# Patient Record
Sex: Female | Born: 1955 | Race: White | Hispanic: No | Smoking: Former smoker
Health system: Southern US, Community
[De-identification: ages and names within clinical notes are randomized; demographics above are authoritative.]

## PROBLEM LIST (undated history)

## (undated) DIAGNOSIS — M1990 Unspecified osteoarthritis, unspecified site: Secondary | ICD-10-CM

## (undated) DIAGNOSIS — Z8719 Personal history of other diseases of the digestive system: Secondary | ICD-10-CM

## (undated) DIAGNOSIS — K219 Gastro-esophageal reflux disease without esophagitis: Secondary | ICD-10-CM

## (undated) DIAGNOSIS — D649 Anemia, unspecified: Secondary | ICD-10-CM

## (undated) DIAGNOSIS — I10 Essential (primary) hypertension: Secondary | ICD-10-CM

## (undated) DIAGNOSIS — G4730 Sleep apnea, unspecified: Secondary | ICD-10-CM

## (undated) DIAGNOSIS — T753XXA Motion sickness, initial encounter: Secondary | ICD-10-CM

## (undated) DIAGNOSIS — T8859XA Other complications of anesthesia, initial encounter: Secondary | ICD-10-CM

## (undated) DIAGNOSIS — T4145XA Adverse effect of unspecified anesthetic, initial encounter: Secondary | ICD-10-CM

## (undated) HISTORY — PX: LAPAROSCOPIC HYSTERECTOMY: SHX1926

## (undated) HISTORY — PX: ABDOMINAL HYSTERECTOMY: SHX81

## (undated) HISTORY — PX: VAGINAL PROLAPSE REPAIR: SHX830

## (undated) HISTORY — PX: CHOLECYSTECTOMY: SHX55

## (undated) HISTORY — PX: INCONTINENCE SURGERY: SHX676

## (undated) HISTORY — PX: BLADDER SUSPENSION: SHX72

## (undated) HISTORY — PX: ECTOPIC PREGNANCY SURGERY: SHX613

---

## 2005-04-04 ENCOUNTER — Ambulatory Visit: Payer: Self-pay | Admitting: Obstetrics and Gynecology

## 2006-04-05 ENCOUNTER — Ambulatory Visit: Payer: Self-pay | Admitting: Obstetrics and Gynecology

## 2006-06-27 ENCOUNTER — Ambulatory Visit: Payer: Self-pay | Admitting: Unknown Physician Specialty

## 2006-09-08 ENCOUNTER — Ambulatory Visit: Payer: Self-pay | Admitting: Unknown Physician Specialty

## 2007-08-29 ENCOUNTER — Ambulatory Visit: Payer: Self-pay | Admitting: Obstetrics and Gynecology

## 2008-09-02 ENCOUNTER — Ambulatory Visit: Payer: Self-pay | Admitting: Obstetrics and Gynecology

## 2009-09-25 ENCOUNTER — Ambulatory Visit: Payer: Self-pay | Admitting: Obstetrics and Gynecology

## 2010-10-08 ENCOUNTER — Ambulatory Visit: Payer: Self-pay | Admitting: Obstetrics and Gynecology

## 2011-10-26 ENCOUNTER — Ambulatory Visit: Payer: Self-pay | Admitting: Obstetrics and Gynecology

## 2012-09-26 ENCOUNTER — Ambulatory Visit: Payer: Self-pay | Admitting: Obstetrics and Gynecology

## 2012-09-26 LAB — URINALYSIS, COMPLETE
Bilirubin,UR: NEGATIVE
Glucose,UR: NEGATIVE mg/dL (ref 0–75)
Ketone: NEGATIVE
Protein: NEGATIVE
RBC,UR: <1 /HPF (ref 0–5)
Squamous Epithelial: 1
WBC UR: 1 /HPF (ref 0–5)

## 2012-09-26 LAB — HEMOGLOBIN: HGB: 13.8 g/dL (ref 12.0–16.0)

## 2012-10-05 ENCOUNTER — Ambulatory Visit: Payer: Self-pay | Admitting: Obstetrics and Gynecology

## 2012-11-01 ENCOUNTER — Ambulatory Visit: Payer: Self-pay | Admitting: Obstetrics and Gynecology

## 2013-04-30 IMAGING — MG MM CAD SCREENING MAMMO
1 series · 6 of 6 positions shown · non-contrast
Comparison: none

REASON FOR EXAM: scr mammo no order
COMMENTS:

PROCEDURE:     MAM - MAM DGTL SCRN MAM NO ORDER W/CAD  - November 01, 2012  [DATE]
RESULT:      Comparison made to prior studies dating to 04/05/2006. No mass.
No pathologic clustered calcification. CAD evaluation is nonfocal.

[R CC · right · 6 of 6 slices shown]
[im 1/6]
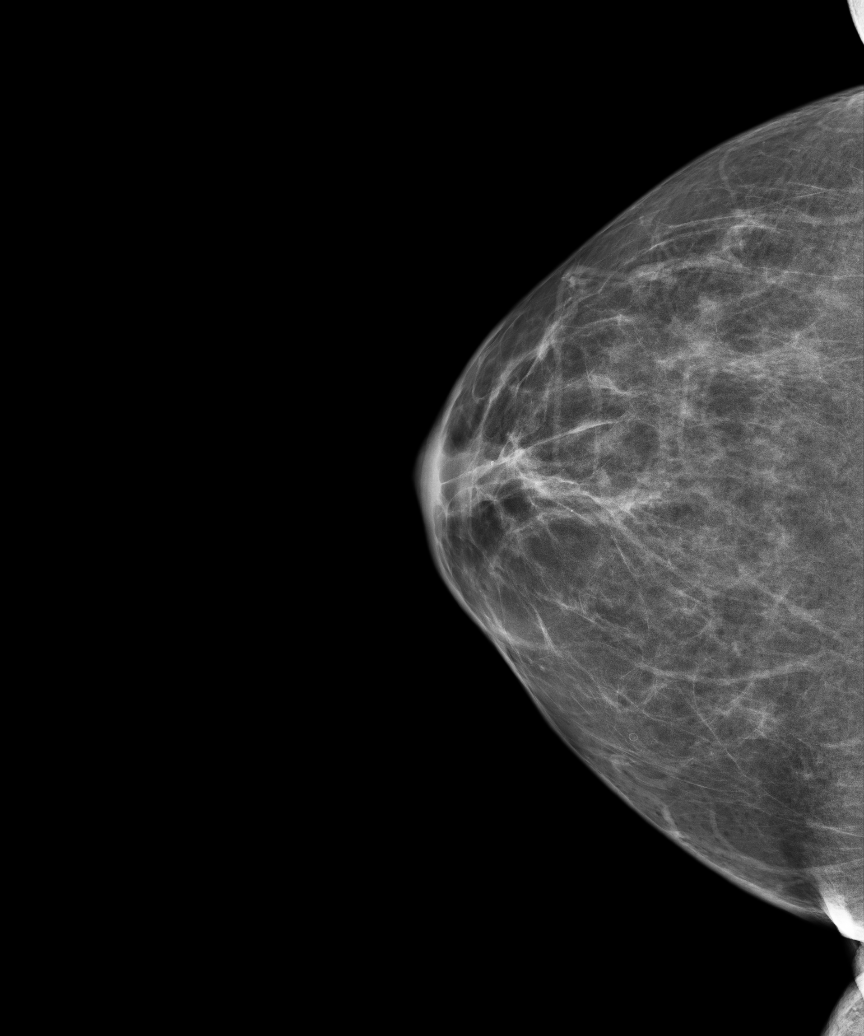
[im 2/6]
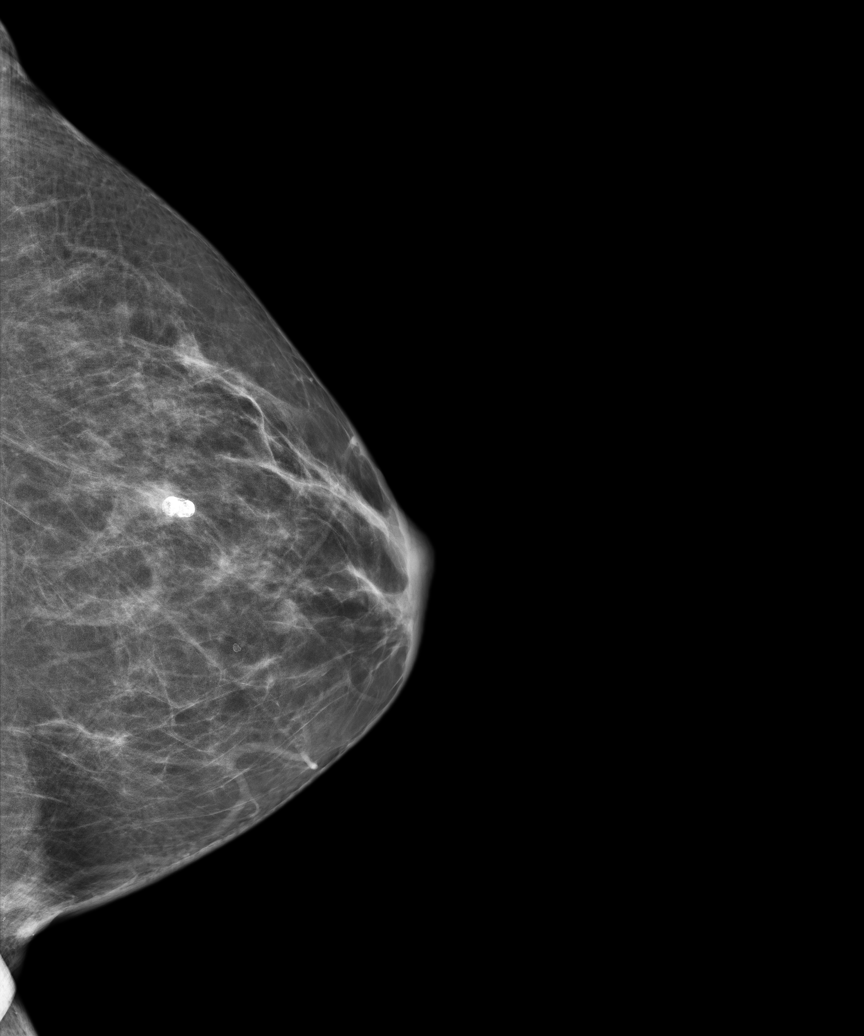
[im 3/6]
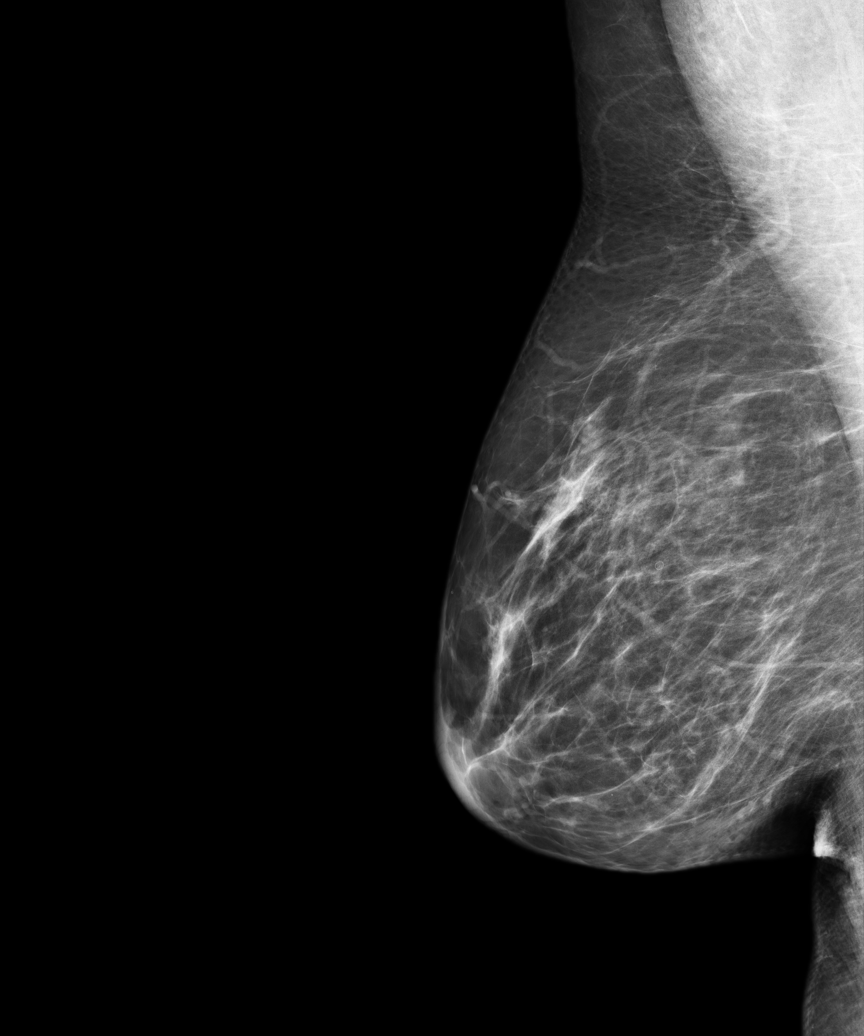
[im 4/6]
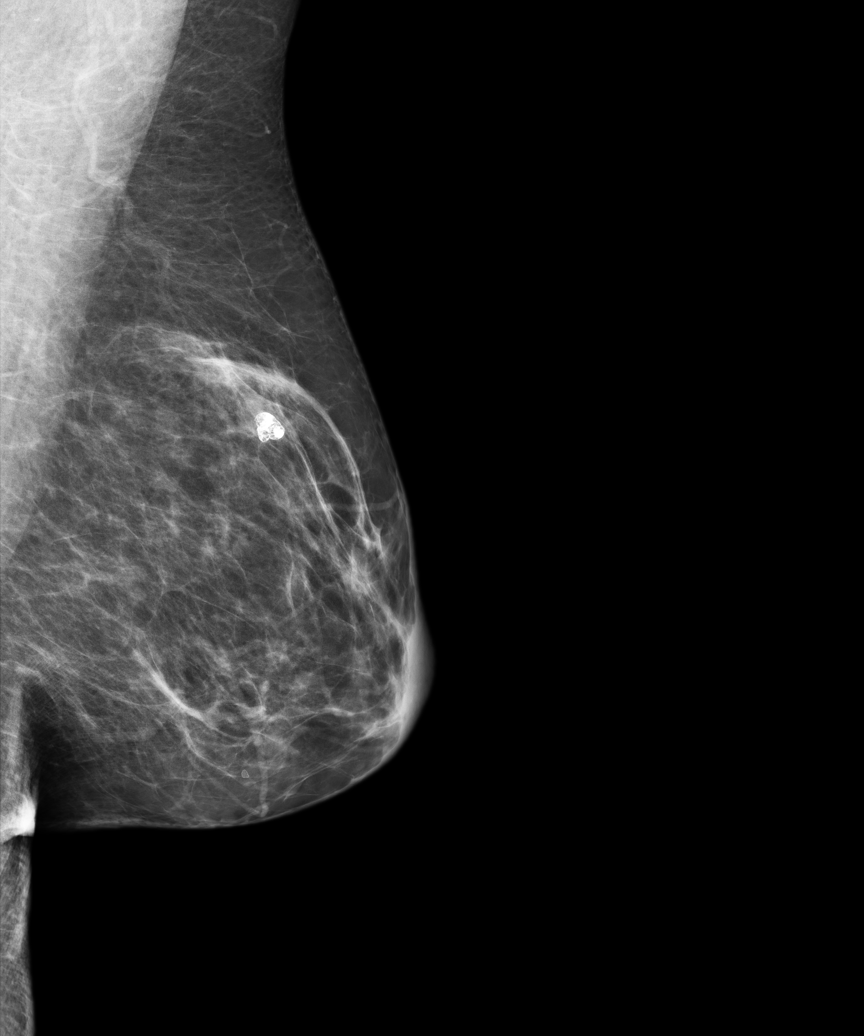
[im 5/6]
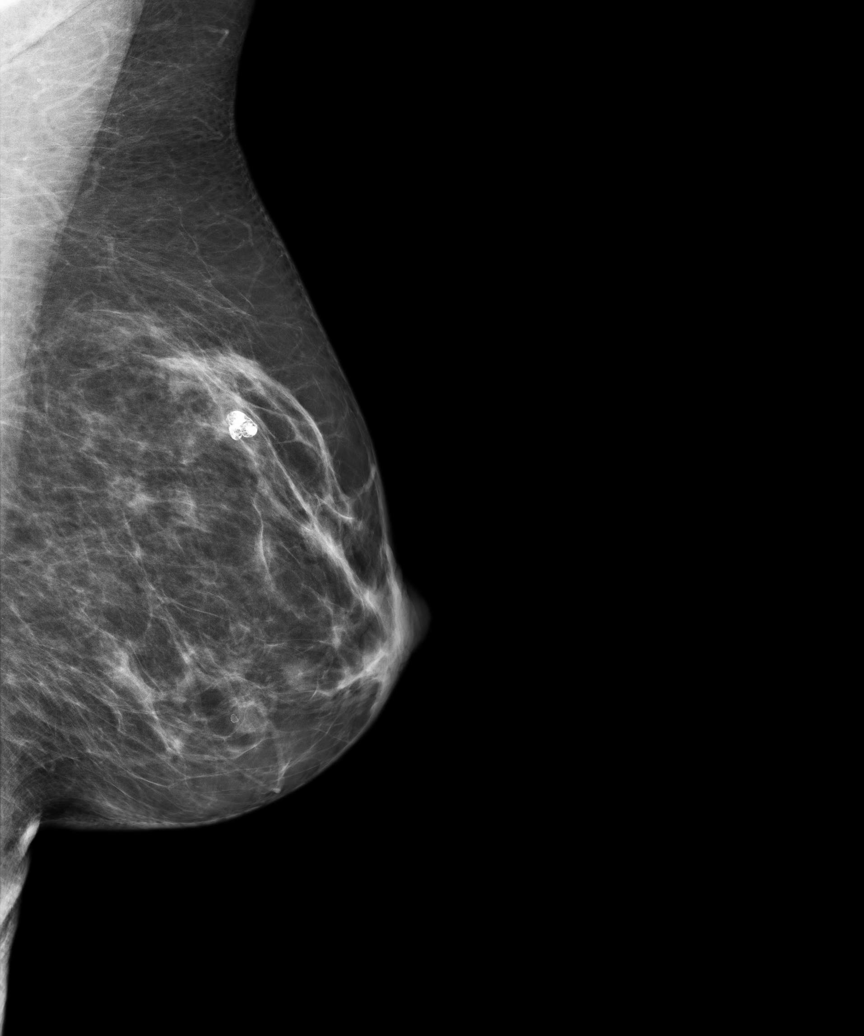
[im 6/6]
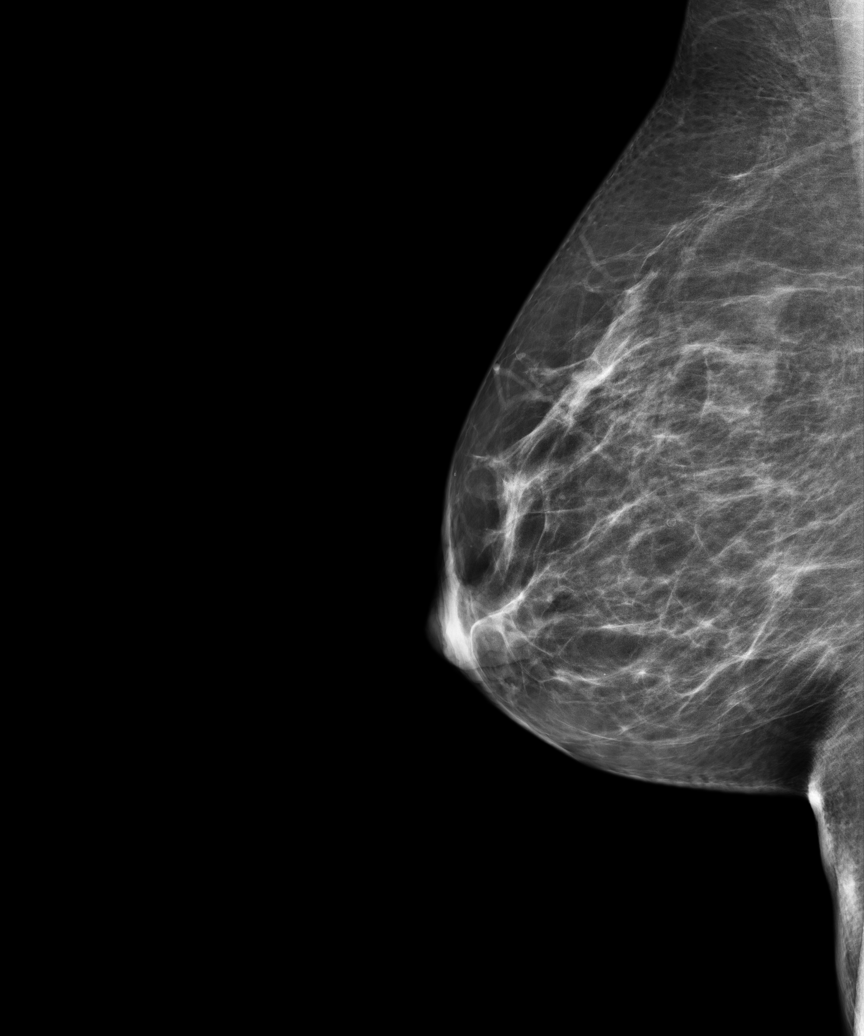

[6 of 6 positions shown; findings below may reference images not displayed]

IMPRESSION: Stable benign exam. Yearly followup exam suggested.

BI-RADS: Category 2- Benign Finding

BREAST COMPOSITION: The breast composition is SCATTERED FIBROGLANDULAR
TISSUE (glandular tissue is 25-50%)

A NEGATIVE MAMMOGRAM REPORT DOES NOT PRECLUDE BIOPSY OR OTHER EVALUATION OF
A CLINICALLY PALPABLE OR OTHERWISE SUSPICIOUS MASS OR LESION. BREAST CANCER
MAY NOT BE DETECTED IN UP TO 10% OF CASES.

## 2013-11-05 ENCOUNTER — Ambulatory Visit: Payer: Self-pay | Admitting: Obstetrics and Gynecology

## 2015-02-17 NOTE — Op Note (Signed)
PATIENT NAME:  Mariah Cooper, Mariah Cooper MR#:  [id number] DATE OF BIRTH:  Aug 23, 1956  DATE OF PROCEDURE:  10/05/2012  PREOPERATIVE DIAGNOSIS: Genuine stress incontinence.   POSTOPERATIVE DIAGNOSIS: Genuine stress incontinence.   PROCEDURE: Tension-free vaginal tape.   SURGEON: Ricky L. Logan BoresEvans, MD  ANESTHESIA: LMA.   FINDINGS: Evidence consistent with stress incontinence on preoperative work-up. Cystoscopy during the procedure showed no entry into the bladder with either arm of the TVT.  ESTIMATED BLOOD LOSS: 75 mL.  COMPLICATIONS: None.   DRAINS: Foley connected to leg bag which will be left in over the weekend.   COMPLICATIONS: None.   SPECIMENS: None.   PROCEDURE IN DETAIL: Preoperative work-up showed genuine stress incontinence. I discussed options and elected to proceed with the above procedure. Consent was signed. Antibiotics were given. She was taken to the operating room and placed in the supine position where anesthesia was initiated and then placed in the dorsal lithotomy position using Allen stirrups, prepped and draped in the usual sterile fashion. An area of approximately 2.5 cm from the external urethral orifice was grasped in the midline and subcutaneously infused with approximately 2 mL of 0.05% Sensorcaine. A #15 blade was used to make a 1.5 cm incision and the areas of periurethral tissue, right and left, were then injected with another 20 mL total of 0.5% Sensorcaine. Shallow dissection of the periurethral area bilaterally was then carried out and obturators were placed using disposable TVT system. Cystoscopy after placement of each arm and prior to removal from applicator and showed no entry into the bladder.   The tape was then adjusted to rest approximately 3 mm below the urethra at rest and protective coating was removed and tapes were trimmed to length. There was oozing from the right periurethral tissue which was controlled with pressure and direct cautery. The  incision was then closed with single deep of 3-0 Vicryl and simple interrupted sutures of 0 and 3-0 Vicryl. The Foley catheter was inserted. Prior to placement of the Foley, dilator was passed through the urethra easily without any evidence of resistance and then catheter was placed and fitted to leg bag.   The catheter was marked in the area where the patient can remove it at home prior to return Monday when she will come back for catheter removal.   We will send her home on antibiotics, bladder anesthetic, and pain medicine for the weekend. Sherlynn Stalls. Light activity and typical precautions.  ____________________________ Reatha Harpsicky L. Logan BoresEvans, MD rle:slb D: 10/05/2012 11:29:19 ET T: 10/05/2012 11:50:03 ET JOB#: [id number]  cc: Ricky L. Logan BoresEvans, MD, <Dictator> Augustina MoodICK L Shital Crayton MD ELECTRONICALLY SIGNED 10/08/2012 7:29

## 2015-08-25 ENCOUNTER — Encounter: Payer: Self-pay | Admitting: Emergency Medicine

## 2015-08-25 ENCOUNTER — Emergency Department: Payer: Worker's Compensation

## 2015-08-25 ENCOUNTER — Emergency Department
Admission: EM | Admit: 2015-08-25 | Discharge: 2015-08-25 | Disposition: A | Discharge status: Home / Self Care | Payer: Worker's Compensation | Attending: Student | Admitting: Student

## 2015-08-25 DIAGNOSIS — Y9289 Other specified places as the place of occurrence of the external cause: Secondary | ICD-10-CM | POA: Insufficient documentation

## 2015-08-25 DIAGNOSIS — Y990 Civilian activity done for income or pay: Secondary | ICD-10-CM | POA: Diagnosis not present

## 2015-08-25 DIAGNOSIS — W07XXXA Fall from chair, initial encounter: Secondary | ICD-10-CM | POA: Diagnosis not present

## 2015-08-25 DIAGNOSIS — S52592A Other fractures of lower end of left radius, initial encounter for closed fracture: Secondary | ICD-10-CM | POA: Insufficient documentation

## 2015-08-25 DIAGNOSIS — Y9389 Activity, other specified: Secondary | ICD-10-CM | POA: Insufficient documentation

## 2015-08-25 DIAGNOSIS — S62102A Fracture of unspecified carpal bone, left wrist, initial encounter for closed fracture: Secondary | ICD-10-CM

## 2015-08-25 DIAGNOSIS — Z87891 Personal history of nicotine dependence: Secondary | ICD-10-CM | POA: Diagnosis not present

## 2015-08-25 DIAGNOSIS — S62101A Fracture of unspecified carpal bone, right wrist, initial encounter for closed fracture: Secondary | ICD-10-CM

## 2015-08-25 DIAGNOSIS — S6991XA Unspecified injury of right wrist, hand and finger(s), initial encounter: Secondary | ICD-10-CM | POA: Diagnosis present

## 2015-08-25 DIAGNOSIS — S52591A Other fractures of lower end of right radius, initial encounter for closed fracture: Secondary | ICD-10-CM | POA: Insufficient documentation

## 2015-08-25 LAB — BASIC METABOLIC PANEL
Anion gap: 7 (ref 5–15)
BUN: 17 mg/dL (ref 6–20)
CHLORIDE: 105 mmol/L (ref 101–111)
CO2: 26 mmol/L (ref 22–32)
Calcium: 10 mg/dL (ref 8.9–10.3)
Creatinine, Ser: 0.8 mg/dL (ref 0.44–1.00)
GFR calc Af Amer: >60 mL/min (ref >60)
GFR calc non Af Amer: >60 mL/min (ref >60)
Glucose, Bld: 168 mg/dL — ABNORMAL HIGH (ref 65–99)
POTASSIUM: 4.6 mmol/L (ref 3.5–5.1)
SODIUM: 138 mmol/L (ref 135–145)

## 2015-08-25 LAB — CBC WITH DIFFERENTIAL/PLATELET
Basophils Absolute: 0 10*3/uL (ref 0–0.1)
Basophils Relative: 0 %
EOS PCT: 1 %
Eosinophils Absolute: 0.1 10*3/uL (ref 0–0.7)
HEMATOCRIT: 39.6 % (ref 35.0–47.0)
HEMOGLOBIN: 13.1 g/dL (ref 12.0–16.0)
LYMPHS ABS: 2.3 10*3/uL (ref 1.0–3.6)
LYMPHS PCT: 19 %
MCH: 31.1 pg (ref 26.0–34.0)
MCHC: 33.1 g/dL (ref 32.0–36.0)
MCV: 94.1 fL (ref 80.0–100.0)
Monocytes Absolute: 1 10*3/uL — ABNORMAL HIGH (ref 0.2–0.9)
Monocytes Relative: 8 %
NEUTROS ABS: 9.1 10*3/uL — AB (ref 1.4–6.5)
Neutrophils Relative %: 72 %
Platelets: 324 10*3/uL (ref 150–440)
RBC: 4.21 MIL/uL (ref 3.80–5.20)
RDW: 12.4 % (ref 11.5–14.5)
WBC: 12.5 10*3/uL — AB (ref 3.6–11.0)

## 2015-08-25 MED ORDER — HYDROMORPHONE HCL 1 MG/ML IJ SOLN
1.0000 mg | Freq: Once | INTRAMUSCULAR | Status: AC
  Administered 2015-08-25: 1 mg via INTRAVENOUS
  Filled 2015-08-25: qty 1

## 2015-08-25 MED ORDER — OXYCODONE-ACETAMINOPHEN 7.5-325 MG PO TABS: 1.0000 | ORAL_TABLET | ORAL | Status: AC | PRN

## 2015-08-25 MED ORDER — HYDROMORPHONE HCL 1 MG/ML IJ SOLN: 1.0000 mg | Freq: Once | INTRAMUSCULAR | Status: DC

## 2015-08-25 NOTE — ED Notes (Signed)
Pt to ed with c/o fall today while at work,  Pt states she was standing in chair and fell,  Pt with c/o pain to bilat wrists.

## 2015-08-25 NOTE — Discharge Instructions (Signed)
Wrist Fracture °A wrist fracture is a break or crack in one of the bones of your wrist. Your wrist is made up of eight small bones at the palm of your hand (carpal bones) and two long bones that make up your forearm (radius and ulna). °CAUSES °· A direct blow to the wrist. °· Falling on an outstretched hand. °· Trauma, such as a car accident or a fall. °RISK FACTORS °Risk factors for wrist fracture include: °· Participating in contact and high-risk sports, such as skiing, biking, and ice skating. °· Taking steroid medicines. °· Smoking. °· Being female. °· Being Caucasian. °· Drinking more than three alcoholic beverages per day. °· Having low or lowered bone density (osteoporosis or osteopenia). °· Age. Older adults have decreased bone density. °· Women who have had menopause. °· History of previous fractures. °SIGNS AND SYMPTOMS °Symptoms of wrist fractures include tenderness, bruising, and inflammation. Additionally, the wrist may hang in an odd position or appear deformed. °DIAGNOSIS °Diagnosis may include: °· Physical exam. °· X-ray. °TREATMENT °Treatment depends on many factors, including the nature and location of the fracture, your age, and your activity level. Treatment for wrist fracture can be nonsurgical or surgical. °Nonsurgical Treatment °A plaster cast or splint may be applied to your wrist if the bone is in a good position. If the fracture is not in good position, it may be necessary for your health care provider to realign it before applying a splint or cast. Usually, a cast or splint will be worn for several weeks. °Surgical Treatment °Sometimes the position of the bone is so far out of place that surgery is required to apply a device to hold it together as it heals. Depending on the fracture, there are a number of options for holding the bone in place while it heals, such as a cast and metal pins. °HOME CARE INSTRUCTIONS °· Keep your injured wrist elevated and move your fingers as much as  possible. °· Do not put pressure on any part of your cast or splint. It may break. °· Use a plastic bag to protect your cast or splint from water while bathing or showering. Do not lower your cast or splint into water. °· Take medicines only as directed by your health care provider. °· Keep your cast or splint clean and dry. If it becomes wet, damaged, or suddenly feels too tight, contact your health care provider right away. °· Do not use any tobacco products including cigarettes, chewing tobacco, or electronic cigarettes. Tobacco can delay bone healing. If you need help quitting, ask your health care provider. °· Keep all follow-up visits as directed by your health care provider. This is important. °· Ask your health care provider if you should take supplements of calcium and vitamins C and D to promote bone healing. °SEEK MEDICAL CARE IF: °· Your cast or splint is damaged, breaks, or gets wet. °· You have a fever. °· You have chills. °· You have continued severe pain or more swelling than you did before the cast was put on. °SEEK IMMEDIATE MEDICAL CARE IF: °· Your hand or fingernails on the injured arm turn blue or gray, or feel cold or numb. °· You have decreased feeling in the fingers of your injured arm. °MAKE SURE YOU: °· Understand these instructions. °· Will watch your condition. °· Will get help right away if you are not doing well or get worse. °  °This information is not intended to replace advice given to you by your   health care provider. Make sure you discuss any questions you have with your health care provider. °  °Document Released: 07/27/2005 Document Revised: 07/08/2015 Document Reviewed: 11/04/2011 °Elsevier Interactive Patient Education ©2016 [employer] ° °Cast or Splint Care °Casts and splints support injured limbs and keep bones from moving while they heal. It is important to care for your cast or splint at home.   °HOME CARE INSTRUCTIONS °· Keep the cast or splint uncovered during the  drying period. It can take 24 to 48 hours to dry if it is made of plaster. A fiberglass cast will dry in less than 1 hour. °· Do not rest the cast on anything harder than a pillow for the first 24 hours. °· Do not put weight on your injured limb or apply pressure to the cast until your health care provider gives you permission. °· Keep the cast or splint dry. Wet casts or splints can lose their shape and may not support the limb as well. A wet cast that has lost its shape can also create harmful pressure on your skin when it dries. Also, wet skin can become infected. °¨ Cover the cast or splint with a plastic bag when bathing or when out in the rain or snow. If the cast is on the trunk of the body, take sponge baths until the cast is removed. °¨ If your cast does become wet, dry it with a towel or a blow dryer on the cool setting only. °· Keep your cast or splint clean. Soiled casts may be wiped with a moistened cloth. °· Do not place any hard or soft foreign objects under your cast or splint, such as cotton, toilet paper, lotion, or powder. °· Do not try to scratch the skin under the cast with any object. The object could get stuck inside the cast. Also, scratching could lead to an infection. If itching is a problem, use a blow dryer on a cool setting to relieve discomfort. °· Do not trim or cut your cast or remove padding from inside of it. °· Exercise all joints next to the injury that are not immobilized by the cast or splint. For example, if you have a long leg cast, exercise the hip joint and toes. If you have an arm cast or splint, exercise the shoulder, elbow, thumb, and fingers. °· Elevate your injured arm or leg on 1 or 2 pillows for the first 1 to 3 days to decrease swelling and pain. It is best if you can comfortably elevate your cast so it is higher than your heart. °SEEK MEDICAL CARE IF:  °· Your cast or splint cracks. °· Your cast or splint is too tight or too loose. °· You have unbearable itching  inside the cast. °· Your cast becomes wet or develops a soft spot or area. °· You have a bad smell coming from inside your cast. °· You get an object stuck under your cast. °· Your skin around the cast becomes red or raw. °· You have new pain or worsening pain after the cast has been applied. °SEEK IMMEDIATE MEDICAL CARE IF:  °· You have fluid leaking through the cast. °· You are unable to move your fingers or toes. °· You have discolored (blue or white), cool, painful, or very swollen fingers or toes beyond the cast. °· You have tingling or numbness around the injured area. °· You have severe pain or pressure under the cast. °· You have any difficulty with your breathing or have shortness of   breath. °· You have chest pain. °  °This information is not intended to replace advice given to you by your health care provider. Make sure you discuss any questions you have with your health care provider. °  °Document Released: 10/14/2000 Document Revised: 08/07/2013 Document Reviewed: 04/25/2013 °Elsevier Interactive Patient Education ©2016 [employer] Inc. ° °

## 2015-08-25 NOTE — ED Provider Notes (Signed)
[facility] Emergency Department Provider Note  ____________________________________________  Time seen: Approximately 9:34 AM  I have reviewed the triage vital signs and the nursing notes.   HISTORY  Chief Complaint Fall and Wrist Pain    HPI Mariah Cooper is a 59 y.o. female patient complaining of bilateral wrist pain and deformity secondary to a fall. Patient is at work stating the chair and fell. Patient broke the fall with hands. Patient rates her pain discomfort as a 5/10. Patient denies any loss of sensation. He stated decreased range of motion secondary to complain of pain. No palliative measures taken for this complaint.   History reviewed. No pertinent past medical history.  There are no active problems to display for this patient.   History reviewed. No pertinent past surgical history.  Current Outpatient Rx  Name  Route  Sig  Dispense  Refill  . oxyCODONE-acetaminophen (PERCOCET) 7.5-325 MG tablet   Oral   Take 1 tablet by mouth every 4 (four) hours as needed for severe pain.   20 tablet   0     Allergies Sulfa antibiotics and Ivp dye  History reviewed. No pertinent family history.  Social History Social History  Substance Use Topics  . Smoking status: Former Games developermoker  . Smokeless tobacco: None  . Alcohol Use: No    Review of Systems Constitutional: No fever/chills Eyes: No visual changes. ENT: No sore throat. Cardiovascular: Denies chest pain. Respiratory: Denies shortness of breath. Gastrointestinal: No abdominal pain.  No nausea, no vomiting.  No diarrhea.  No constipation. Genitourinary: Negative for dysuria. Musculoskeletal bilateral wrist pain  Skin: Negative for rash. Neurological: Negative for headaches, focal weakness or numbness. Endocrine: Hematological/Lymphatic: Allergic/Immunilogical: Sulfur and IVP dye  10-point ROS otherwise  negative.  ____________________________________________   PHYSICAL EXAM:  VITAL SIGNS: ED Triage Vitals  Enc Vitals Group     BP 08/25/15 0842 155/55 mmHg     Pulse Rate 08/25/15 0842 57     Resp 08/25/15 0842 20     Temp 08/25/15 0842 97.7 F (36.5 C)     Temp Source 08/25/15 0842 Oral     SpO2 08/25/15 0842 98 %     Weight 08/25/15 0842 150 lb (68.04 kg)     Height 08/25/15 0842 5\' 2"  (1.575 m)     Head Cir --      Peak Flow --      Pain Score 08/25/15 0843 5     Pain Loc --      Pain Edu? --      Excl. in GC? --     Constitutional: Alert and oriented. Well appearing and in no acute distress. Eyes: Conjunctivae are normal. PERRL. EOMI. Head: Atraumatic. Nose: No congestion/rhinnorhea. Mouth/Throat: Mucous membranes are moist.  Oropharynx non-erythematous. Neck: No stridor.  No cervical spine tenderness to palpation. Hematological/Lymphatic/Immunilogical: No cervical lymphadenopathy. Cardiovascular: Normal rate, regular rhythm. Grossly normal heart sounds.  Good peripheral circulation. Respiratory: Normal respiratory effort.  No retractions. Lungs CTAB. Gastrointestinal: Soft and nontender. No distention. No abdominal bruits. No CVA tenderness. Musculoskeletal: No lower extremity tenderness nor edema.  No joint effusions. Neurologic:  Normal speech and language. No gross focal neurologic deficits are appreciated. No gait instability. Skin:  Skin is warm, dry and intact. No rash noted. Psychiatric: Mood and affect are normal. Speech and behavior are normal.  ____________________________________________   LABS (all labs ordered are listed, but only abnormal results are displayed)  Labs Reviewed  CBC WITH DIFFERENTIAL/PLATELET -  Abnormal; Notable for the following:    WBC 12.5 (*)    Neutro Abs 9.1 (*)    Monocytes Absolute 1.0 (*)    All other components within normal limits  BASIC METABOLIC PANEL    ____________________________________________  EKG   ____________________________________________  RADIOLOGY  Right wrist soles of dorsal impacted distal radial fracture. Left wrist shows a comminuted and displaced intra-articular distal radial fracture. I, Joni Reining, personally viewed and evaluated these images (plain radiographs) as part of my medical decision making.   ____________________________________________   PROCEDURES  Procedure(s) performed: None  Critical Care performed: No  ____________________________________________   INITIAL IMPRESSION / ASSESSMENT AND PLAN / ED COURSE  Pertinent labs & imaging results that were available during my care of the patient were reviewed by me and considered in my medical decision making (see chart for details).  Discussed x-ray findings with palpation. Ortho called at  0930 hrs. Patient given IV access and 1 mg of Dilaudid is given. Recall Ortho 1010 hrs. discussed patient with Ortho doctor at 1045. Ortho doctor call back after reviewing films. Per Dr. Hyacinth Meeker orthopedic Dr. patient replaced some bilateral sugar tong splints, prescription for pain medication, and follow-up in his office in 2 days. Patient is pending surgery on 08/31/2015. ____________________________________________   FINAL CLINICAL IMPRESSION(S) / ED DIAGNOSES  Final diagnoses:  Wrist fracture, right, closed, initial encounter  Left wrist fracture, closed, initial encounter      Joni Reining, PA-C 08/25/15 1114  Gayla Doss, MD 08/25/15 [phone]

## 2015-08-28 ENCOUNTER — Encounter: Payer: Self-pay | Admitting: *Deleted

## 2015-08-28 ENCOUNTER — Inpatient Hospital Stay: Admission: RE | Admit: 2015-08-28 | Payer: Self-pay | Source: Ambulatory Visit

## 2015-08-28 NOTE — Patient Instructions (Signed)
  Your procedure is scheduled on: 08-31-15 Report to [facility] 2ND FLOOR To find out your arrival time please call [phone] between 1PM - 3PM on 08-28-15  Remember: Instructions that are not followed completely may result in serious medical risk, up to and including death, or upon the discretion of your surgeon and anesthesiologist your surgery may need to be rescheduled.    _X___ 1. Do not eat food or drink liquids after midnight. No gum chewing or hard candies.     _X___ 2. No Alcohol for 24 hours before or after surgery.   ____ 3. Bring all medications with you on the day of surgery if instructed.    ____ 4. Notify your doctor if there is any change in your medical condition     (cold, fever, infections).     Do not wear jewelry, make-up, hairpins, clips or nail polish.  Do not wear lotions, powders, or perfumes. You may wear deodorant.  Do not shave 48 hours prior to surgery. Men may shave face and neck.  Do not bring valuables to the hospital.    [facility] any belongings or valuables.               Contacts, dentures or bridgework may not be worn into surgery.  Leave your suitcase in the car. After surgery it may be brought to your room.  For patients admitted to the hospital, discharge time is determined by your treatment team.   Patients discharged the day of surgery will not be allowed to drive home.   Please read over the following fact sheets that you were given:      __X__ Take these medicines the morning of surgery with A SIP OF WATER:    1. MAGNESIUM  2. MAY TAKE HYDROCODONE IF NEEDED  3.   4.  5.  6.  ____ Fleet Enema (as directed)   ____ Use CHG Soap as directed  ____ Use inhalers on the day of surgery  ____ Stop metformin 2 days prior to surgery    ____ Take 1/2 of usual insulin dose the night before surgery and none on the morning of surgery.   ____ Stop Coumadin/Plavix/aspirin-N/A  ____ Stop  Anti-inflammatories-NO NSAIDS OR ASA PRODUCTS   ____ Stop supplements until after surgery.    ____ Bring C-Pap to the hospital.

## 2015-08-28 NOTE — Pre-Procedure Instructions (Signed)
SPOKE WITH DR Randa NgoPISCITELLO ABOUT SURGEON JUST STARTING PT ON MELOXICAM 15 MG PO YESTERDAY (08-27-15).  DR Randa NgoPISCITELLO STATES THAT HE IS FINE WITH HER CONTINUING MELOXICAM BUT ULTIMATLEY THAT IS THE SURGEONS CALL IF THE PT NEEDS TO STOP IT.  [facility]. SHE SAID DR MILLER HAD LEFT FOR THE DAY AND THAT SHE WOULD TEXT HIM TO SEE IF HE WOULD RESPOND REGARDING PTS MELOXICAM

## 2015-08-31 ENCOUNTER — Ambulatory Visit
Admission: RE | Admit: 2015-08-31 | Discharge: 2015-08-31 | Disposition: A | Discharge status: Home / Self Care | Payer: Worker's Compensation | Source: Ambulatory Visit | Attending: Specialist | Admitting: Specialist

## 2015-08-31 ENCOUNTER — Encounter: Payer: Self-pay | Admitting: *Deleted

## 2015-08-31 ENCOUNTER — Ambulatory Visit: Payer: Worker's Compensation | Admitting: Anesthesiology

## 2015-08-31 ENCOUNTER — Encounter
Admission: RE | Disposition: A | Discharge status: Home / Self Care | Payer: Self-pay | Source: Ambulatory Visit | Attending: Specialist

## 2015-08-31 DIAGNOSIS — S52532A Colles' fracture of left radius, initial encounter for closed fracture: Secondary | ICD-10-CM | POA: Insufficient documentation

## 2015-08-31 DIAGNOSIS — Z87891 Personal history of nicotine dependence: Secondary | ICD-10-CM | POA: Insufficient documentation

## 2015-08-31 DIAGNOSIS — S52502A Unspecified fracture of the lower end of left radius, initial encounter for closed fracture: Secondary | ICD-10-CM | POA: Diagnosis present

## 2015-08-31 DIAGNOSIS — Y999 Unspecified external cause status: Secondary | ICD-10-CM | POA: Insufficient documentation

## 2015-08-31 DIAGNOSIS — X58XXXA Exposure to other specified factors, initial encounter: Secondary | ICD-10-CM | POA: Insufficient documentation

## 2015-08-31 DIAGNOSIS — D649 Anemia, unspecified: Secondary | ICD-10-CM | POA: Diagnosis not present

## 2015-08-31 DIAGNOSIS — Y939 Activity, unspecified: Secondary | ICD-10-CM | POA: Diagnosis not present

## 2015-08-31 DIAGNOSIS — Y929 Unspecified place or not applicable: Secondary | ICD-10-CM | POA: Insufficient documentation

## 2015-08-31 DIAGNOSIS — K219 Gastro-esophageal reflux disease without esophagitis: Secondary | ICD-10-CM | POA: Diagnosis not present

## 2015-08-31 DIAGNOSIS — K449 Diaphragmatic hernia without obstruction or gangrene: Secondary | ICD-10-CM | POA: Diagnosis not present

## 2015-08-31 HISTORY — DX: Anemia, unspecified: D64.9

## 2015-08-31 HISTORY — DX: Personal history of other diseases of the digestive system: Z87.19

## 2015-08-31 HISTORY — DX: Other complications of anesthesia, initial encounter: T88.59XA

## 2015-08-31 HISTORY — PX: OPEN REDUCTION INTERNAL FIXATION (ORIF) DISTAL RADIAL FRACTURE: SHX5989

## 2015-08-31 HISTORY — DX: Gastro-esophageal reflux disease without esophagitis: K21.9

## 2015-08-31 HISTORY — DX: Adverse effect of unspecified anesthetic, initial encounter: T41.45XA

## 2015-08-31 SURGERY — OPEN REDUCTION INTERNAL FIXATION (ORIF) DISTAL RADIUS FRACTURE
Anesthesia: General | Laterality: Left

## 2015-08-31 MED ORDER — CEFAZOLIN SODIUM-DEXTROSE 2-3 GM-% IV SOLR
INTRAVENOUS | Status: AC
  Administered 2015-08-31: 2 g via INTRAVENOUS
  Filled 2015-08-31: qty 50

## 2015-08-31 MED ORDER — NEOMYCIN-POLYMYXIN B GU 40-200000 IR SOLN
Status: DC | PRN
  Administered 2015-08-31: 2 mL

## 2015-08-31 MED ORDER — BUPIVACAINE HCL (PF) 0.5 % IJ SOLN
INTRAMUSCULAR | Status: AC
  Filled 2015-08-31: qty 30

## 2015-08-31 MED ORDER — DEXAMETHASONE SODIUM PHOSPHATE 4 MG/ML IJ SOLN
INTRAMUSCULAR | Status: DC | PRN
  Administered 2015-08-31: 10 mg via INTRAVENOUS

## 2015-08-31 MED ORDER — FAMOTIDINE 20 MG PO TABS
ORAL_TABLET | ORAL | Status: AC
  Filled 2015-08-31: qty 1

## 2015-08-31 MED ORDER — GABAPENTIN 400 MG PO CAPS: 400.0000 mg | ORAL_CAPSULE | Freq: Three times a day (TID) | ORAL | Status: DC

## 2015-08-31 MED ORDER — LABETALOL HCL 5 MG/ML IV SOLN
5.0000 mg | Freq: Once | INTRAVENOUS | Status: AC
  Administered 2015-08-31: 5 mg via INTRAVENOUS

## 2015-08-31 MED ORDER — GABAPENTIN 400 MG PO CAPS
ORAL_CAPSULE | ORAL | Status: AC
  Filled 2015-08-31: qty 1

## 2015-08-31 MED ORDER — NEOMYCIN-POLYMYXIN B GU 40-200000 IR SOLN
Status: AC
  Filled 2015-08-31: qty 2

## 2015-08-31 MED ORDER — FENTANYL CITRATE (PF) 100 MCG/2ML IJ SOLN
25.0000 ug | INTRAMUSCULAR | Status: DC | PRN
  Administered 2015-08-31 (×4): 25 ug via INTRAVENOUS

## 2015-08-31 MED ORDER — HYDRALAZINE HCL 20 MG/ML IJ SOLN
10.0000 mg | Freq: Once | INTRAMUSCULAR | Status: AC
  Administered 2015-08-31: 10 mg via INTRAVENOUS

## 2015-08-31 MED ORDER — LABETALOL HCL 5 MG/ML IV SOLN
INTRAVENOUS | Status: AC
  Administered 2015-08-31: 5 mg via INTRAVENOUS
  Filled 2015-08-31: qty 4

## 2015-08-31 MED ORDER — LACTATED RINGERS IV SOLN
INTRAVENOUS | Status: DC
  Administered 2015-08-31 (×3): via INTRAVENOUS

## 2015-08-31 MED ORDER — HYDRALAZINE HCL 20 MG/ML IJ SOLN
INTRAMUSCULAR | Status: AC
  Administered 2015-08-31: 10 mg via INTRAVENOUS
  Filled 2015-08-31: qty 1

## 2015-08-31 MED ORDER — FAMOTIDINE 20 MG PO TABS
20.0000 mg | ORAL_TABLET | Freq: Once | ORAL | Status: AC
  Administered 2015-08-31: 20 mg via ORAL

## 2015-08-31 MED ORDER — FENTANYL CITRATE (PF) 100 MCG/2ML IJ SOLN
INTRAMUSCULAR | Status: AC
  Administered 2015-08-31: 25 ug via INTRAVENOUS
  Filled 2015-08-31: qty 2

## 2015-08-31 MED ORDER — GABAPENTIN 400 MG PO CAPS
400.0000 mg | ORAL_CAPSULE | Freq: Once | ORAL | Status: AC
  Administered 2015-08-31: 400 mg via ORAL

## 2015-08-31 MED ORDER — MELOXICAM 7.5 MG PO TABS
ORAL_TABLET | ORAL | Status: AC
  Filled 2015-08-31: qty 2

## 2015-08-31 MED ORDER — MELOXICAM 7.5 MG PO TABS
15.0000 mg | ORAL_TABLET | Freq: Once | ORAL | Status: AC
Start: 2015-08-31 — End: 2015-08-31
  Administered 2015-08-31: 15 mg via ORAL

## 2015-08-31 MED ORDER — FENTANYL CITRATE (PF) 100 MCG/2ML IJ SOLN
INTRAMUSCULAR | Status: DC | PRN
  Administered 2015-08-31 (×2): 25 ug via INTRAVENOUS
  Administered 2015-08-31: 100 ug via INTRAVENOUS

## 2015-08-31 MED ORDER — CEFAZOLIN SODIUM-DEXTROSE 2-3 GM-% IV SOLR: 2.0000 g | Freq: Once | INTRAVENOUS | Status: DC

## 2015-08-31 MED ORDER — BUPIVACAINE HCL (PF) 0.5 % IJ SOLN
INTRAMUSCULAR | Status: DC | PRN
  Administered 2015-08-31: 30 mL

## 2015-08-31 MED ORDER — NIFEDIPINE 10 MG PO CAPS
10.0000 mg | ORAL_CAPSULE | Freq: Once | ORAL | Status: AC
Start: 2015-08-31 — End: 2015-08-31
  Administered 2015-08-31: 10 mg via SUBLINGUAL
  Filled 2015-08-31: qty 1

## 2015-08-31 MED ORDER — ONDANSETRON HCL 4 MG/2ML IJ SOLN: 4.0000 mg | Freq: Once | INTRAMUSCULAR | Status: DC | PRN

## 2015-08-31 SURGICAL SUPPLY — 45 items
2.5 DRILL BIT IMPLANT
BIT DRILL 2 FAST STEP (BIT) ×3 IMPLANT
BIT DRILL 2.5X4 QC (BIT) ×3 IMPLANT
BLADE SURG MINI STRL (BLADE) ×3 IMPLANT
BNDG COHESIVE 4X5 TAN STRL (GAUZE/BANDAGES/DRESSINGS) IMPLANT
BNDG ESMARK 4X12 TAN STRL LF (GAUZE/BANDAGES/DRESSINGS) ×3 IMPLANT
BRUSH SCRUB 4% CHG (MISCELLANEOUS) ×3 IMPLANT
CANISTER SUCT 1200ML W/VALVE (MISCELLANEOUS) ×3 IMPLANT
CHLORAPREP W/TINT 26ML (MISCELLANEOUS) ×3 IMPLANT
DECANTER SPIKE VIAL GLASS SM (MISCELLANEOUS) ×3 IMPLANT
DRAPE FLUOR MINI C-ARM 54X84 (DRAPES) ×3 IMPLANT
GAUZE FLUFF 18X24 1PLY STRL (GAUZE/BANDAGES/DRESSINGS) ×3 IMPLANT
GAUZE PETRO XEROFOAM 1X8 (MISCELLANEOUS) ×3 IMPLANT
GAUZE SPONGE 4X4 12PLY STRL (GAUZE/BANDAGES/DRESSINGS) IMPLANT
GLOVE BIO SURGEON STRL SZ7.5 (GLOVE) ×3 IMPLANT
GLOVE BIOGEL PI IND STRL 8 (GLOVE) ×1 IMPLANT
GLOVE BIOGEL PI INDICATOR 8 (GLOVE) ×2
GLOVE INDICATOR 8.0 STRL GRN (GLOVE) ×3 IMPLANT
GLOVE SURG ORTHO 8.5 STRL (GLOVE) ×3 IMPLANT
GOWN STRL REUS W/ TWL LRG LVL3 (GOWN DISPOSABLE) ×2 IMPLANT
GOWN STRL REUS W/TWL LRG LVL3 (GOWN DISPOSABLE) ×4
K-WIRE 1.6 (WIRE) ×2
K-WIRE FX5X1.6XNS BN SS (WIRE) ×1
KIT RM TURNOVER STRD PROC AR (KITS) ×3 IMPLANT
KWIRE FX5X1.6XNS BN SS (WIRE) ×1 IMPLANT
NDL SAFETY 18GX1.5 (NEEDLE) ×3 IMPLANT
NS IRRIG 500ML POUR BTL (IV SOLUTION) ×3 IMPLANT
PACK EXTREMITY ARMC (MISCELLANEOUS) ×3 IMPLANT
PAD GROUND ADULT SPLIT (MISCELLANEOUS) ×3 IMPLANT
PADDING CAST 4IN STRL (MISCELLANEOUS) ×2
PADDING CAST BLEND 4X4 STRL (MISCELLANEOUS) ×1 IMPLANT
PEG SUBCHONDRAL SMOOTH 2.0X16 (Peg) ×3 IMPLANT
PEG SUBCHONDRAL SMOOTH 2.0X20 (Peg) ×6 IMPLANT
PEG SUBCHONDRAL SMOOTH 2.0X22 (Peg) ×3 IMPLANT
PEG SUBCHONDRAL SMOOTH 2.0X24 (Peg) ×3 IMPLANT
PLATE SHORT 57 NRRW LT (Plate) ×3 IMPLANT
SCREW CORT 3.5X10 LNG (Screw) ×6 IMPLANT
SPLINT CAST 1 STEP 3X12 (MISCELLANEOUS) ×3 IMPLANT
SPLINT CAST 1 STEP 5X30 WHT (MISCELLANEOUS) IMPLANT
SPONGE LAP 18X18 5 PK (GAUZE/BANDAGES/DRESSINGS) ×3 IMPLANT
STAPLER SKIN PROX 35W (STAPLE) ×3 IMPLANT
STOCKINETTE BIAS CUT 4 980044 (GAUZE/BANDAGES/DRESSINGS) ×3 IMPLANT
STOCKINETTE STRL 4IN 9604848 (GAUZE/BANDAGES/DRESSINGS) ×3 IMPLANT
SUT VIC AB 3-0 SH 27 (SUTURE) ×2
SUT VIC AB 3-0 SH 27X BRD (SUTURE) ×1 IMPLANT

## 2015-08-31 NOTE — Progress Notes (Signed)
Operative (left arm) wrapped in ace wrap - SAGED areas around wrap only preop  Pt has cast on right arm as well for additional break - IV in right upper arm above casted area

## 2015-08-31 NOTE — Anesthesia Postprocedure Evaluation (Signed)
  Anesthesia Post-op Note  Patient: Mariah Cooper  Procedure(s) Performed: Procedure(s): OPEN REDUCTION INTERNAL FIXATION (ORIF) DISTAL RADIAL FRACTURE (Left)  Anesthesia type:General LMA  Patient location: PACU  Post pain: Pain level controlled  Post assessment: Post-op Vital signs reviewed, Patient's Cardiovascular Status Stable, Respiratory Function Stable, Patent Airway and No signs of Nausea or vomiting  Post vital signs: Reviewed and stable  Last Vitals:  Filed Vitals:   08/31/15 1847  BP: 173/79  Pulse: 73  Temp:   Resp: 14    Level of consciousness: awake, alert  and patient cooperative  Complications: No apparent anesthesia complications

## 2015-08-31 NOTE — Anesthesia Procedure Notes (Signed)
Procedure Name: LMA Insertion Date/Time: 08/31/2015 3:35 PM Performed by: Junious SilkNOLES, Kaianna Dolezal Pre-anesthesia Checklist: Patient identified, Patient being monitored, Timeout performed, Emergency Drugs available and Suction available Patient Re-evaluated:Patient Re-evaluated prior to inductionOxygen Delivery Method: Circle system utilized Preoxygenation: Pre-oxygenation with 100% oxygen Intubation Type: IV induction Ventilation: Mask ventilation without difficulty LMA: LMA inserted Tube type: Oral Number of attempts: 1 Placement Confirmation: positive ETCO2 and breath sounds checked- equal and bilateral Tube secured with: Tape Dental Injury: Teeth and Oropharynx as per pre-operative assessment

## 2015-08-31 NOTE — Op Note (Signed)
08/31/2015  4:50 PM  PATIENT:  Mariah Cooper    PRE-OPERATIVE DIAGNOSIS:  closed colles fracture left   POST-OPERATIVE DIAGNOSIS:  Same  PROCEDURE:  OPEN REDUCTION INTERNAL FIXATION (ORIF) DISTAL RADIAL FRACTURE  SURGEON:  Valinda HoarMILLER,Broderic Bara E, MD  ANESTHESIA:   General  PREOPERATIVE INDICATIONS:  Mariah Cooper is a  59 y.o. female with a diagnosis of closed colles fracture left  who failed conservative measures and elected for surgical management.    The risks benefits and alternatives were discussed with the patient preoperatively including but not limited to the risks of infection, bleeding, nerve injury, malunion, nonunion, wrist stiffness, persistent wrist pain, osteoarthritis and the need for further surgery. Medical risks include but are not limited to DVT and pulmonary embolism, myocardial infarction, stroke, pneumonia, respiratory failure and death. Patient and her husband understood these risks and wished to proceed.   OPERATIVE IMPLANTS: [employer] hand innovations plate 3 hole narrow  OPERATIVE FINDINGS: Comminuted unstable left distal radius fracture  OPERATIVE PROCEDURE: Patient was seen in the preoperative area. I marked the left hand with the word yes and my initials according the hospital's correct site of surgery protocol. I answered all questions by the patient. Patient was then brought to the operating room where she was placed supine on the operative table. She underwent general anesthesia with an LMA.   The operative arm was prepped and draped in a sterile fashion. A timeout performed to verify the patient's name, date of birth, medical record number, correct site of surgery correct procedure to be performed. The timeout was also used a timeout to verify patient received antibiotics and appropriate instruments, implants and radiographs studies were available in the room. Once all in attendance were in agreement case began.   Patient then had the operative  extremity exsanguinated with an Esmarch. The tourniquet was placed on the upper extremity and inflated 250 mm.  A manual reduction of the fracture was performed. The fracture reduction was confirmed on FluoroScan imaging.  A linear incision was then made over the FCR tendon. The subcutaneous tissue was carefully dissected using Metzenbaum scissor and Adson pickup. Retractors were used to protect the radial artery and median nerve. The pronator quadratus was identified and incised and elevated off the volar surface of the distal radius. A 3  hole [employer] volar plate was then positioned on the under surface of the distal radius. It was held into position with 2 K wires. The position of the plate was confirmed on AP and lateral images. Once the plate was in good position a shaft screw was placed bicortically. This is a 10 mm in length. The attention was then turned to the distal pegs. The proximal row of pegs was placed first. Each individual peg hole was drilled and then measured with a depth gauge. The proximal row had 3 threaded pegs placed. The distal row was then drilled and smooth pegs were placed. The position and length of all screws were confirmed on AP and lateral FluoroScan imaging. Care was taken to avoid penetration of any peg through the articular surface of the distal radius.  Once all distal pegs were placed, the attention was turned back to placement of bicortical shaft screws. 2 additional screws were placed in the plate, for a total of 3 bicortical shaft screws. The wound was then copiously irrigated. Final FluoroScan imaging of the construct were taken. The fracture was in anatomic position and the hardware was well-positioned. The wound again was copiously  irrigated. The soft tissue was then carefully over the plate. The skin was closed with staples. Xeroform and a dry sterile dressing were applied along with an AP splint. I was scrubbed and present for the entire case and all sharp and  instrument counts were correct at the conclusion the case. The patient tolerated this procedure well. I spoke with her sister in the postop consultation room to let her know the case had gone without complication and the patient was stable in recovery room.   Valinda Hoar, MD

## 2015-08-31 NOTE — H&P (Signed)
THE PATIENT WAS SEEN IN THE HOLDING AREA.  HISTORY, ALLERGIES, HOME MEDICATIONS AND OPERATIVE PROCEDURE WERE REVIEWED. RISKS AND BENEFITS OF SURGERY DISCUSSED WITH PATIENT AGAIN.  NO CHANGES FROM INITIAL HISTORY AND PHYSICAL NOTED.    

## 2015-08-31 NOTE — Anesthesia Preprocedure Evaluation (Signed)
Anesthesia Evaluation  Patient identified by MRN, date of birth, ID band Patient awake    Reviewed: Allergy & Precautions, H&P , NPO status , Patient's Chart, lab work & pertinent test results  History of Anesthesia Complications (+) PROLONGED EMERGENCE, Emergence Delirium and history of anesthetic complications  Airway Mallampati: III  TM Distance: >3 FB Neck ROM: limited    Dental no notable dental hx. (+) Teeth Intact   Pulmonary neg shortness of breath, former smoker,    Pulmonary exam normal breath sounds clear to auscultation       Cardiovascular Exercise Tolerance: Good (-) angina(-) Past MI and (-) DOE negative cardio ROS Normal cardiovascular exam Rhythm:regular Rate:Normal     Neuro/Psych negative neurological ROS  negative psych ROS   GI/Hepatic Neg liver ROS, hiatal hernia, GERD  Controlled,  Endo/Other  negative endocrine ROS  Renal/GU negative Renal ROS  negative genitourinary   Musculoskeletal   Abdominal   Peds  Hematology negative hematology ROS (+)   Anesthesia Other Findings Past Medical History:   History of hiatal hernia                                     Anemia                                                       GERD (gastroesophageal reflux disease)                         Comment:WILL OCCASSIONALLY TAKE MAALOX   Complication of anesthesia                                     Comment:HARD TIME WAKING UP-PT DOES NOT WANT GENERAL               ANESTHESIA  Past Surgical History:   ABDOMINAL HYSTERECTOMY                                        ECTOPIC PREGNANCY SURGERY                                     BLADDER SUSPENSION                                           BMI    Body Mass Index   27.42 kg/m 2      Reproductive/Obstetrics negative OB ROS                             Anesthesia Physical Anesthesia Plan  ASA: III  Anesthesia Plan: General LMA    Post-op Pain Management:    Induction:   Airway Management Planned:   Additional Equipment:   Intra-op Plan:   Post-operative Plan:   Informed Consent: I have reviewed the patients History and Physical,  chart, labs and discussed the procedure including the risks, benefits and alternatives for the proposed anesthesia with the patient or authorized representative who has indicated his/her understanding and acceptance.   Dental Advisory Given  Plan Discussed with: Anesthesiologist, CRNA and Surgeon  Anesthesia Plan Comments:         Anesthesia Quick Evaluation

## 2015-08-31 NOTE — Transfer of Care (Signed)
Immediate Anesthesia Transfer of Care Note  Patient: Mariah Cooper  Procedure(s) Performed: Procedure(s): OPEN REDUCTION INTERNAL FIXATION (ORIF) DISTAL RADIAL FRACTURE (Left)  Patient Location: PACU  Anesthesia Type:General  Level of Consciousness: sedated  Airway & Oxygen Therapy: Patient Spontanous Breathing and Patient connected to face mask oxygen  Post-op Assessment: Report given to RN and Post -op Vital signs reviewed and stable  Post vital signs: Reviewed and stable  Last Vitals:  Filed Vitals:   08/31/15 1332  BP: 197/90  Pulse: 62  Temp: 37.6 C  Resp: 18    Complications: No apparent anesthesia complications

## 2015-08-31 NOTE — Discharge Instructions (Signed)
Elevate arms at all times Ice as needed

## 2015-09-01 ENCOUNTER — Encounter: Payer: Self-pay | Admitting: Specialist

## 2015-11-18 ENCOUNTER — Other Ambulatory Visit: Payer: Self-pay | Admitting: Internal Medicine

## 2015-11-18 DIAGNOSIS — Z1231 Encounter for screening mammogram for malignant neoplasm of breast: Secondary | ICD-10-CM

## 2015-11-24 ENCOUNTER — Ambulatory Visit
Admission: RE | Admit: 2015-11-24 | Discharge: 2015-11-24 | Disposition: A | Discharge status: Home / Self Care | Payer: 59 | Source: Ambulatory Visit | Attending: Internal Medicine | Admitting: Internal Medicine

## 2015-11-24 DIAGNOSIS — Z1231 Encounter for screening mammogram for malignant neoplasm of breast: Secondary | ICD-10-CM | POA: Diagnosis not present

## 2016-11-23 ENCOUNTER — Other Ambulatory Visit: Payer: Self-pay | Admitting: Internal Medicine

## 2016-11-23 DIAGNOSIS — Z1231 Encounter for screening mammogram for malignant neoplasm of breast: Secondary | ICD-10-CM

## 2016-12-20 ENCOUNTER — Ambulatory Visit
Admission: RE | Admit: 2016-12-20 | Discharge: 2016-12-20 | Disposition: A | Discharge status: Home / Self Care | Payer: BLUE CROSS/BLUE SHIELD | Source: Ambulatory Visit | Attending: Internal Medicine | Admitting: Internal Medicine

## 2016-12-20 ENCOUNTER — Other Ambulatory Visit: Payer: Self-pay | Admitting: Internal Medicine

## 2016-12-20 DIAGNOSIS — Z1231 Encounter for screening mammogram for malignant neoplasm of breast: Secondary | ICD-10-CM | POA: Insufficient documentation

## 2017-11-22 ENCOUNTER — Other Ambulatory Visit: Payer: Self-pay | Admitting: Internal Medicine

## 2017-11-22 DIAGNOSIS — Z1231 Encounter for screening mammogram for malignant neoplasm of breast: Secondary | ICD-10-CM

## 2017-12-21 ENCOUNTER — Ambulatory Visit
Admission: RE | Admit: 2017-12-21 | Discharge: 2017-12-21 | Disposition: A | Discharge status: Home / Self Care | Payer: BLUE CROSS/BLUE SHIELD | Source: Ambulatory Visit | Attending: Internal Medicine | Admitting: Internal Medicine

## 2017-12-21 DIAGNOSIS — Z1231 Encounter for screening mammogram for malignant neoplasm of breast: Secondary | ICD-10-CM | POA: Insufficient documentation

## 2017-12-22 ENCOUNTER — Other Ambulatory Visit: Payer: Self-pay | Admitting: Internal Medicine

## 2017-12-22 DIAGNOSIS — R928 Other abnormal and inconclusive findings on diagnostic imaging of breast: Secondary | ICD-10-CM

## 2017-12-22 DIAGNOSIS — N6310 Unspecified lump in the right breast, unspecified quadrant: Secondary | ICD-10-CM

## 2017-12-29 ENCOUNTER — Ambulatory Visit
Admission: RE | Admit: 2017-12-29 | Discharge: 2017-12-29 | Disposition: A | Discharge status: Home / Self Care | Payer: BLUE CROSS/BLUE SHIELD | Source: Ambulatory Visit | Attending: Internal Medicine | Admitting: Internal Medicine

## 2017-12-29 DIAGNOSIS — N6311 Unspecified lump in the right breast, upper outer quadrant: Secondary | ICD-10-CM | POA: Insufficient documentation

## 2017-12-29 DIAGNOSIS — R928 Other abnormal and inconclusive findings on diagnostic imaging of breast: Secondary | ICD-10-CM

## 2017-12-29 DIAGNOSIS — N6312 Unspecified lump in the right breast, upper inner quadrant: Secondary | ICD-10-CM | POA: Insufficient documentation

## 2017-12-29 DIAGNOSIS — N6310 Unspecified lump in the right breast, unspecified quadrant: Secondary | ICD-10-CM

## 2018-01-01 ENCOUNTER — Other Ambulatory Visit: Payer: Self-pay | Admitting: Internal Medicine

## 2018-01-01 DIAGNOSIS — R928 Other abnormal and inconclusive findings on diagnostic imaging of breast: Secondary | ICD-10-CM

## 2018-01-01 DIAGNOSIS — N6310 Unspecified lump in the right breast, unspecified quadrant: Secondary | ICD-10-CM

## 2018-01-05 ENCOUNTER — Other Ambulatory Visit: Payer: Self-pay | Admitting: Internal Medicine

## 2018-01-05 ENCOUNTER — Ambulatory Visit
Admission: RE | Admit: 2018-01-05 | Discharge: 2018-01-05 | Disposition: A | Discharge status: Home / Self Care | Payer: BLUE CROSS/BLUE SHIELD | Source: Ambulatory Visit | Attending: Internal Medicine | Admitting: Internal Medicine

## 2018-01-05 DIAGNOSIS — N6001 Solitary cyst of right breast: Secondary | ICD-10-CM | POA: Insufficient documentation

## 2018-01-05 DIAGNOSIS — N6310 Unspecified lump in the right breast, unspecified quadrant: Secondary | ICD-10-CM | POA: Diagnosis present

## 2018-01-05 DIAGNOSIS — R928 Other abnormal and inconclusive findings on diagnostic imaging of breast: Secondary | ICD-10-CM | POA: Diagnosis not present

## 2018-01-15 HISTORY — PX: BREAST CYST ASPIRATION: SHX578

## 2018-12-20 ENCOUNTER — Telehealth: Payer: Self-pay | Admitting: *Deleted

## 2018-12-20 DIAGNOSIS — Z87891 Personal history of nicotine dependence: Secondary | ICD-10-CM

## 2018-12-20 DIAGNOSIS — Z122 Encounter for screening for malignant neoplasm of respiratory organs: Secondary | ICD-10-CM

## 2018-12-20 NOTE — Telephone Encounter (Signed)
Received referral for low dose lung cancer screening CT scan. Message left at phone number listed in EMR for patient to call me back to facilitate scheduling scan.  

## 2018-12-20 NOTE — Telephone Encounter (Signed)
Received referral for initial lung cancer screening scan. Contacted patient and obtained smoking history,(former, quit9/28/16, 42 pack year) as well as answering questions related to screening process. Patient denies signs of lung cancer such as weight loss or hemoptysis. Patient denies comorbidity that would prevent curative treatment if lung cancer were found. Patient is scheduled for shared decision making visit and CT scan on 01/08/19 at 330pm.

## 2019-01-07 ENCOUNTER — Other Ambulatory Visit: Payer: Self-pay | Admitting: Internal Medicine

## 2019-01-07 DIAGNOSIS — Z1231 Encounter for screening mammogram for malignant neoplasm of breast: Secondary | ICD-10-CM

## 2019-01-08 ENCOUNTER — Encounter: Payer: Self-pay | Admitting: Oncology

## 2019-01-08 ENCOUNTER — Ambulatory Visit
Admission: RE | Admit: 2019-01-08 | Discharge: 2019-01-08 | Disposition: A | Discharge status: Home / Self Care | Payer: BLUE CROSS/BLUE SHIELD | Source: Ambulatory Visit | Attending: Oncology | Admitting: Oncology

## 2019-01-08 ENCOUNTER — Inpatient Hospital Stay: Payer: BLUE CROSS/BLUE SHIELD | Attending: Oncology | Admitting: Oncology

## 2019-01-08 ENCOUNTER — Other Ambulatory Visit: Payer: Self-pay

## 2019-01-08 DIAGNOSIS — Z87891 Personal history of nicotine dependence: Secondary | ICD-10-CM | POA: Diagnosis present

## 2019-01-08 DIAGNOSIS — Z122 Encounter for screening for malignant neoplasm of respiratory organs: Secondary | ICD-10-CM

## 2019-01-09 ENCOUNTER — Telehealth: Payer: Self-pay | Admitting: *Deleted

## 2019-01-09 NOTE — Telephone Encounter (Signed)
Notified patient of LDCT lung cancer screening program results with recommendation for 12 month follow up imaging. Also notified of incidental findings noted below and is encouraged to discuss further with PCP who will receive a copy of this note and/or the CT report. Patient verbalizes understanding.   IMPRESSION: 1. Lung-RADS 1, negative. Continue annual screening with low-dose chest CT without contrast in 12 months. 2. Aortic atherosclerosis. 3. Mild diffuse bronchial wall thickening with mild centrilobular and paraseptal emphysema.  Aortic Atherosclerosis (ICD10-I70.0) and Emphysema (ICD10-J43.9).

## 2019-01-11 NOTE — Progress Notes (Signed)
In accordance with CMS guidelines, patient has met eligibility criteria including age, absence of signs or symptoms of lung cancer.  Social History   Tobacco Use  . Smoking status: Former Smoker    Packs/day: 1.00    Years: 42.00    Pack years: 42.00    Types: Cigarettes    Last attempt to quit: 07/29/2015    Years since quitting: 3.4  Substance Use Topics  . Alcohol use: No  . Drug use: No     A shared decision-making session was conducted prior to the performance of CT scan. This includes one or more decision aids, includes benefits and harms of screening, follow-up diagnostic testing, over-diagnosis, false positive rate, and total radiation exposure.  Counseling on the importance of adherence to annual lung cancer LDCT screening, impact of co-morbidities, and ability or willingness to undergo diagnosis and treatment is imperative for compliance of the program.  Counseling on the importance of continued smoking cessation for former smokers; the importance of smoking cessation for current smokers, and information about tobacco cessation interventions have been given to patient including [place] and [employer] quit [place] programs.  Written order for lung cancer screening with LDCT has been given to the patient and any and all questions have been answered to the best of my abilities.   Yearly follow up will be coordinated by Burgess Estelle, Thoracic Navigator.  Faythe Casa, NP 01/11/2019 11:38 AM

## 2019-01-16 ENCOUNTER — Other Ambulatory Visit: Payer: Self-pay

## 2019-01-16 ENCOUNTER — Encounter: Payer: Self-pay | Admitting: *Deleted

## 2019-01-16 ENCOUNTER — Ambulatory Visit
Admission: RE | Admit: 2019-01-16 | Discharge: 2019-01-16 | Disposition: A | Discharge status: Home / Self Care | Payer: BLUE CROSS/BLUE SHIELD | Source: Ambulatory Visit | Attending: Internal Medicine | Admitting: Internal Medicine

## 2019-01-16 DIAGNOSIS — Z1231 Encounter for screening mammogram for malignant neoplasm of breast: Secondary | ICD-10-CM | POA: Diagnosis not present

## 2019-05-14 ENCOUNTER — Encounter: Payer: Self-pay | Admitting: Urology

## 2019-05-14 ENCOUNTER — Ambulatory Visit (INDEPENDENT_AMBULATORY_CARE_PROVIDER_SITE_OTHER): Payer: BC Managed Care – PPO | Admitting: Urology

## 2019-05-14 ENCOUNTER — Other Ambulatory Visit: Payer: Self-pay

## 2019-05-14 VITALS — BP 195/75 | HR 74 | Ht 62.0 in | Wt 162.0 lb

## 2019-05-14 DIAGNOSIS — R350 Frequency of micturition: Secondary | ICD-10-CM | POA: Diagnosis not present

## 2019-05-14 DIAGNOSIS — N3941 Urge incontinence: Secondary | ICD-10-CM

## 2019-05-14 DIAGNOSIS — N3281 Overactive bladder: Secondary | ICD-10-CM | POA: Diagnosis not present

## 2019-05-14 LAB — BLADDER SCAN AMB NON-IMAGING

## 2019-05-14 NOTE — Progress Notes (Signed)
05/14/2019 1:57 PM   Mariah Cooper 08/09/1956 [id number]  Referring provider: Gracelyn NurseJohnston, John D, MD [address] [place] [id number]  Chief Complaint  Patient presents with  . Over Active Bladder    HPI: 63 yo female who presents today for further evaluation of refractory urinary frequency and urgency.  She reports today that just in the last year, she is developed severe urinary frequency and urgency.  She voids at least 20 times per day and gets up 3-4 times at night to void.  She denies any urge incontinence or stress incontinence.  She reports that this is a fairly abrupt change that happened a year ago and prior to this, she voided normally was only getting up 0-1 times at night.  No alleviating or exacerbating factors.  She had a bladder sling in 2013 by Dr. Logan BoresEvans. 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  She reports that after her Foley catheter was removed, she was unable to void normally.  About 1 week later, she had to have something loosened which she describes some sort of office procedure.  She is s/p vaginal hysterectomy.    She tried Vesicare 5 mg --> 10 mg which did not help.  With this medication, she had trouble starting her urinary stream.  She was also prescribed Mybetriq which she was unable to afford.    She is worried that she is not able to completely empty her bladder.    UA 1/20 negative.    She avoids drinking fluids due to urinary frequency.  2 cups of coffee in the AM, 20-40 oz water, and 1 glass of tea daily.    Occasionally constipated.   No UTIs.    Quit smoking 4 years ago, 1 ppd x 42 years.  No hematuria.     PMH: Past Medical History:  Diagnosis Date  . Anemia   . Complication of anesthesia    HARD TIME WAKING UP-PT DOES NOT WANT GENERAL ANESTHESIA  . GERD (gastroesophageal reflux disease)    WILL OCCASSIONALLY TAKE MAALOX  . History of hiatal hernia     Surgical History:  Past Surgical History:  Procedure Laterality Date  . ABDOMINAL HYSTERECTOMY    . BLADDER SUSPENSION    . BREAST CYST ASPIRATION Right 01/15/2018   Neg  . ECTOPIC PREGNANCY SURGERY    . OPEN REDUCTION INTERNAL FIXATION (ORIF) DISTAL RADIAL FRACTURE Left 08/31/2015   Procedure: OPEN REDUCTION INTERNAL FIXATION (ORIF) DISTAL RADIAL FRACTURE;  Surgeon: Deeann SaintHoward Miller, MD;  Location: [facility] ORS;  Service: Orthopedics;  Laterality: Left;    Home Medications:  Allergies as of 05/14/2019      Reactions   Sulfa Antibiotics Itching   Ivp Dye [iodinated Diagnostic Agents] Rash   Tape Rash   OK TO USE PAPER TAPE      Medication List       Accurate as of May 14, 2019  1:57 PM. If you have any questions, ask your nurse or doctor.        STOP taking these medications   gabapentin 400 MG capsule Commonly known as: Neurontin Stopped by: Vanna ScotlandAshley Arryanna Holquin, MD   HYDROcodone-acetaminophen 7.5-325 MG tablet Commonly known as: NORCO Stopped by: Vanna ScotlandAshley Birtha Hatler, MD   meloxicam 15 MG tablet Commonly known as: MOBIC Stopped by: Vanna ScotlandAshley Teleah Villamar, MD     TAKE these medications   Magnesium 250 MG Tabs Take 1 tablet by mouth daily.  Allergies:  Allergies  Allergen Reactions  . Sulfa Antibiotics Itching  . Ivp Dye [Iodinated Diagnostic Agents] Rash  . Tape Rash    OK TO USE PAPER TAPE    Family History: Family History  Problem Relation Age of Onset  . Breast cancer Neg Hx     Social History:  reports that she quit smoking about 3 years ago. Her smoking use included cigarettes. She has a 42.00 pack-year smoking history. She has never used smokeless tobacco. She reports that she does not drink alcohol or use drugs.  ROS: UROLOGY Frequent Urination?: Yes Hard to postpone urination?: No Burning/pain with urination?: No Get up at night to urinate?: Yes Leakage of urine?: No Urine stream starts and stops?: Yes Trouble starting stream?: No Do you have to strain to urinate?: No  Blood in urine?: No Urinary tract infection?: No Sexually transmitted disease?: No Injury to kidneys or bladder?: No Painful intercourse?: No Weak stream?: No Currently pregnant?: No Vaginal bleeding?: No Last menstrual period?: n  Gastrointestinal Nausea?: No Vomiting?: No Indigestion/heartburn?: No Diarrhea?: No Constipation?: No  Constitutional Fever: No Night sweats?: No Weight loss?: No Fatigue?: No  Skin Skin rash/lesions?: No Itching?: No  Eyes Blurred vision?: No Double vision?: No  Ears/Nose/Throat Sore throat?: No Sinus problems?: No  Hematologic/Lymphatic Swollen glands?: No Easy bruising?: No  Cardiovascular Leg swelling?: No Chest pain?: No  Respiratory Cough?: No Shortness of breath?: No  Endocrine Excessive thirst?: No  Musculoskeletal Back pain?: No Joint pain?: Yes  Neurological Headaches?: No Dizziness?: No  Psychologic Depression?: No Anxiety?: No  Physical Exam: BP (!) 195/75   Pulse 74   Ht 5\' 2"  (1.575 m)   Wt 162 lb (73.5 kg)   BMI 29.63 kg/m   Constitutional:  Alert and oriented, No acute distress. HEENT: [place] AT, moist mucus membranes.  Trachea midline, no masses. Cardiovascular: No clubbing, cyanosis, or edema. Respiratory: Normal respiratory effort, no increased work of breathing. GI: Abdomen is soft, nontender, nondistended, no abdominal masses Skin: No rashes, bruises or suspicious lesions. Neurologic: Grossly intact, no focal deficits, moving all 4 extremities. Psychiatric: Normal mood and affect.  Laboratory Data: Lab Results  Component Value Date   WBC 12.5 (H) 08/25/2015   HGB 13.1 08/25/2015   HCT 39.6 08/25/2015   MCV 94.1 08/25/2015   PLT 324 08/25/2015    Lab Results  Component Value Date   CREATININE 0.80 08/25/2015   Results for orders placed or performed in visit on 05/14/19  Microscopic Examination   URINE  Result Value Ref Range   WBC, UA 0-5 0 - 5 /hpf   RBC None seen 0 - 2 /hpf    Epithelial Cells (non renal) 0-10 0 - 10 /hpf   Casts Present (A) None seen /lpf   Cast Type Hyaline casts N/A   Crystals Present (A) N/A   Crystal Type Amorphous Sediment N/A   Bacteria, UA Few None seen/Few  Urinalysis, Complete  Result Value Ref Range   Specific Gravity, UA 1.020 1.005 - 1.030   pH, UA 7.0 5.0 - 7.5   Color, UA Yellow Yellow   Appearance Ur Clear Clear   Leukocytes,UA Negative Negative   Protein,UA Negative Negative/Trace   Glucose, UA Negative Negative   Ketones, UA Negative Negative   RBC, UA Trace (A) Negative   Bilirubin, UA Negative Negative   Urobilinogen, Ur 0.2 0.2 - 1.0 mg/dL   Nitrite, UA Negative Negative   Microscopic Examination See below:   Bladder Scan (  Post Void Residual) in office  Result Value Ref Range   Scan Result 5ml     Pertinent Imaging: PVR as above   Assessment & Plan:    1. Overactive bladder Concerned today about possible underlying pathology given her fairly abrupt onset of urinary symptoms with no clear precipitating factors over the past year  PVR borderline today.  We did discuss behavioral modification at length today including cutting back coffee, tea and other irritating beverages along with double voiding and timed voiding.  I recommended pursuing cystoscopy to evaluate her bladder to rule out any underlying pathology.  We discussed procedure in detail.  All of her questions were answered and is agreeable with this plan.  We will perform pelvic at the same time.  If there is no any underlying pathology, will consider alternative treatment options for refractory OAB once behavioral intervention has been pursued.  - Urinalysis, Complete - Bladder Scan (Post Void Residual) in office  2. Urinary frequency As above  3. Urge incontinence As above   Return in about 2 weeks (around 05/28/2019) for cysto.  Hollice Espy, MD  [facility] [address] [place], [place]  [address] [phone]

## 2019-05-15 LAB — MICROSCOPIC EXAMINATION: RBC: NONE SEEN /hpf (ref 0–2)

## 2019-05-15 LAB — URINALYSIS, COMPLETE
Bilirubin, UA: NEGATIVE
Glucose, UA: NEGATIVE
Ketones, UA: NEGATIVE
Leukocytes,UA: NEGATIVE
Nitrite, UA: NEGATIVE
Protein,UA: NEGATIVE
Specific Gravity, UA: 1.02 (ref 1.005–1.030)
Urobilinogen, Ur: 0.2 mg/dL (ref 0.2–1.0)
pH, UA: 7 (ref 5.0–7.5)

## 2019-06-05 ENCOUNTER — Other Ambulatory Visit: Payer: Self-pay

## 2019-06-05 ENCOUNTER — Ambulatory Visit (INDEPENDENT_AMBULATORY_CARE_PROVIDER_SITE_OTHER): Payer: BC Managed Care – PPO | Admitting: Urology

## 2019-06-05 ENCOUNTER — Encounter: Payer: Self-pay | Admitting: Urology

## 2019-06-05 VITALS — BP 164/82 | HR 69 | Ht 62.0 in | Wt 157.0 lb

## 2019-06-05 DIAGNOSIS — N3941 Urge incontinence: Secondary | ICD-10-CM

## 2019-06-05 DIAGNOSIS — R350 Frequency of micturition: Secondary | ICD-10-CM

## 2019-06-05 DIAGNOSIS — N3281 Overactive bladder: Secondary | ICD-10-CM | POA: Diagnosis not present

## 2019-06-05 LAB — URINALYSIS, COMPLETE
Bilirubin, UA: NEGATIVE
Glucose, UA: NEGATIVE
Ketones, UA: NEGATIVE
Leukocytes,UA: NEGATIVE
Nitrite, UA: NEGATIVE
Protein,UA: NEGATIVE
RBC, UA: NEGATIVE
Specific Gravity, UA: 1.02 (ref 1.005–1.030)
Urobilinogen, Ur: 0.2 mg/dL (ref 0.2–1.0)
pH, UA: 7 (ref 5.0–7.5)

## 2019-06-05 LAB — MICROSCOPIC EXAMINATION
Bacteria, UA: NONE SEEN
RBC: NONE SEEN /hpf (ref 0–2)
WBC, UA: NONE SEEN /hpf (ref 0–5)

## 2019-06-05 NOTE — Progress Notes (Signed)
   06/05/19  CC:  Chief Complaint  Patient presents with  . Cysto    HPI: 63 year old female with severe refractory OAB.  She presents today for cystoscopic evaluation as well as pelvic exam.  Please see previous notes for details.  She had very little change with behavioral modification.  Height 5\' 2"  (1.575 m). NED. A&Ox3.   No respiratory distress   Abd soft, NT, ND Normal external genitalia with patent urethral meatus Pelvic exam was performed at the end of cystoscopy.  This revealed minimal cystocele with good apical support.  No rectocele.  Mild vaginal atrophy, otherwise unremarkable pelvic exam  Cystoscopy Procedure Note  Patient identification was confirmed, informed consent was obtained, and patient was prepped using Betadine solution.  Lidocaine jelly was administered per urethral meatus.    Procedure: - Flexible cystoscope introduced, without any difficulty.   - Thorough search of the bladder revealed:    normal urethral meatus    normal urothelium    no stones    no ulcers     no tumors    no urethral polyps    no trabeculation  - Ureteral orifices were normal in position and appearance.  Very fine cobblestoning/trigonitis in between the [place].  Clear efflux of urine from both.  Post-Procedure: - Patient tolerated the procedure well  Assessment/ Plan:  1. Overactive bladder No bladder pathology on cystoscopy today which is reassuring Pelvic exam is also fairly unremarkable without significant pelvic organ prolapse She is unable to afford Myrbetriq and is failed Vesicare 5 to 10 mg  She was given samples of Toviaz 8 mg today x1 month.  If she fails these medications, she was also provided literature today on Botox and posterior nerve stimulation as alternatives.  We will have her follow-up in 1 month to reassess her symptoms.  We will discuss refractory OAB treatments based on above. - Urinalysis, Complete  2. Urinary frequency As above - Urinalysis,  Complete  3. Urge incontinence As above - Urinalysis, Complete  F/u 1 month   Hollice Espy, MD

## 2019-06-26 ENCOUNTER — Encounter: Payer: Self-pay | Admitting: Urology

## 2019-07-10 ENCOUNTER — Ambulatory Visit (INDEPENDENT_AMBULATORY_CARE_PROVIDER_SITE_OTHER): Payer: BC Managed Care – PPO | Admitting: Urology

## 2019-07-10 ENCOUNTER — Encounter: Payer: Self-pay | Admitting: Urology

## 2019-07-10 ENCOUNTER — Other Ambulatory Visit: Payer: Self-pay

## 2019-07-10 VITALS — BP 191/79 | HR 80 | Ht 62.0 in | Wt 157.0 lb

## 2019-07-10 DIAGNOSIS — N3281 Overactive bladder: Secondary | ICD-10-CM | POA: Diagnosis not present

## 2019-07-10 DIAGNOSIS — N3001 Acute cystitis with hematuria: Secondary | ICD-10-CM

## 2019-07-10 DIAGNOSIS — R350 Frequency of micturition: Secondary | ICD-10-CM | POA: Diagnosis not present

## 2019-07-10 DIAGNOSIS — N3941 Urge incontinence: Secondary | ICD-10-CM

## 2019-07-10 NOTE — Progress Notes (Signed)
07/10/2019 2:56 PM   Mariah Cooper [birth year] [id number]  Referring provider: Baxter Hire, MD [place],  [place] [address]  Chief Complaint  Patient presents with  . Follow-up    HPI: 63 year old female with refractory OAB symptoms including urgency frequency and nocturia who returns today to discuss second line treatment options.  She is tried and failed multiple medications including Vesicare, Myrbetriq, most recently [place].  She reports that Toviaz specifically made her extremely thirsty which caused her to drink more water which in fact made her symptoms worse in terms of urinary urgency and frequency.  Please see previous notes for details.  Work-up including cystoscopy negative.  She has been working on behavioral modification is cut back on caffeinated beverages with no significant improvement.  She is considering Botox versus posterior nerve stimulation.  She is worried about coverage by her insurance company.  She was last week she does feel like she was getting a twinge of a urinary tract infection with some mild dysuria.  This subsided spontaneously and did not recur.  She is asymptomatic today however her urine is somewhat suspicious appearing.   PMH: Past Medical History:  Diagnosis Date  . Anemia   . Complication of anesthesia    HARD TIME WAKING UP-PT DOES NOT WANT GENERAL ANESTHESIA  . GERD (gastroesophageal reflux disease)    WILL OCCASSIONALLY TAKE MAALOX  . History of hiatal hernia     Surgical History: Past Surgical History:  Procedure Laterality Date  . ABDOMINAL HYSTERECTOMY    . BLADDER SUSPENSION    . BREAST CYST ASPIRATION Right 01/15/2018   Neg  . ECTOPIC PREGNANCY SURGERY    . OPEN REDUCTION INTERNAL FIXATION (ORIF) DISTAL RADIAL FRACTURE Left 08/31/2015   Procedure: OPEN REDUCTION INTERNAL FIXATION (ORIF) DISTAL RADIAL FRACTURE;  Surgeon: Earnestine Leys, MD;  Location: [facility] ORS;  Service: Orthopedics;   Laterality: Left;    Home Medications:  Allergies as of 07/10/2019      Reactions   Sulfa Antibiotics Itching   Ivp Dye [iodinated Diagnostic Agents] Rash   Tape Rash   OK TO USE PAPER TAPE      Medication List       Accurate as of July 10, 2019  2:56 PM. If you have any questions, ask your nurse or doctor.        alendronate 70 MG tablet Commonly known as: FOSAMAX TAKE 1 TABLET (70 MG TOTAL) BY MOUTH EVERY 7 (SEVEN) DAYS   Magnesium 250 MG Tabs Take 1 tablet by mouth daily.       Allergies:  Allergies  Allergen Reactions  . Sulfa Antibiotics Itching  . Ivp Dye [Iodinated Diagnostic Agents] Rash  . Tape Rash    OK TO USE PAPER TAPE    Family History: Family History  Problem Relation Age of Onset  . Breast cancer Neg Hx     Social History:  reports that she quit smoking about 3 years ago. Her smoking use included cigarettes. She has a 42.00 pack-year smoking history. She has never used smokeless tobacco. She reports that she does not drink alcohol or use drugs.  ROS: UROLOGY Frequent Urination?: Yes Hard to postpone urination?: No Burning/pain with urination?: No Get up at night to urinate?: Yes Leakage of urine?: No Urine stream starts and stops?: No Trouble starting stream?: No Do you have to strain to urinate?: No Blood in urine?: No Urinary tract infection?: No Sexually transmitted disease?: No Injury to kidneys or bladder?:  No Painful intercourse?: No Weak stream?: No Currently pregnant?: No Vaginal bleeding?: No Last menstrual period?: n  Gastrointestinal Nausea?: No Vomiting?: No Indigestion/heartburn?: No Diarrhea?: No Constipation?: No  Constitutional Fever: No Night sweats?: No Weight loss?: No Fatigue?: No  Skin Skin rash/lesions?: No Itching?: No  Eyes Blurred vision?: No Double vision?: No  Ears/Nose/Throat Sore throat?: No Sinus problems?: No  Hematologic/Lymphatic Swollen glands?: No Easy bruising?: No   Cardiovascular Leg swelling?: No Chest pain?: No  Respiratory Cough?: No Shortness of breath?: No  Endocrine Excessive thirst?: No  Musculoskeletal Back pain?: No Joint pain?: No  Neurological Headaches?: No Dizziness?: No  Psychologic Depression?: No Anxiety?: No  Physical Exam: BP (!) 191/79   Pulse 80   Ht 5\' 2"  (1.575 m)   Wt 157 lb (71.2 kg)   BMI 28.72 kg/m   Constitutional:  Alert and oriented, No acute distress. HEENT: [place] AT, moist mucus membranes.  Trachea midline, no masses. Cardiovascular: No clubbing, cyanosis, or edema. Respiratory: Normal respiratory effort, no increased work of breathing. Skin: No rashes, bruises or suspicious lesions. Neurologic: Grossly intact, no focal deficits, moving all 4 extremities. Psychiatric: Normal mood and affect.  Laboratory Data: Lab Results  Component Value Date   WBC 12.5 (H) 08/25/2015   HGB 13.1 08/25/2015   HCT 39.6 08/25/2015   MCV 94.1 08/25/2015   PLT 324 08/25/2015    Lab Results  Component Value Date   CREATININE 0.80 08/25/2015    Urinalysis UA with greater than 30 white blood cells, 3-10 red blood cells and some bacteria.  Nitrate negative.   Assessment & Plan:    1. Urinary frequency Refractory OAB symptoms who is failed multiple medical treatment options She is interested in Botox versus PTNS This was discussed at length again today in the office and she was previously provided with literature for this She is very concerned about postoperative urinary retention associated with Botox, reassured that this is generally less than 5% of the time She may be leaning towards PTNS.  She like to wait until October when her insurance changes to Medicare only. She will send us a MyChart message when she decides on which she would like to pursue All questions answered today - CULTURE, URINE COMPREHENSIVE - Urinalysis, Complete  2. Overactive bladder As above  3. Urge incontinence As above  4.  Acute cystitis with hematuria UA today is mildly suspicious for UTI Given that she has no deviation of her baseline symptoms, will hold off on treating her unless her urine culture results with a clonal species She is agreeable this plan   Vanna ScotlandAshley Deonte Otting, MD  [facility] [address], [address] [place], [place] [address] [phone]

## 2019-07-11 LAB — URINALYSIS, COMPLETE
Bilirubin, UA: NEGATIVE
Glucose, UA: NEGATIVE
Ketones, UA: NEGATIVE
Nitrite, UA: NEGATIVE
Protein,UA: NEGATIVE
Specific Gravity, UA: 1.02 (ref 1.005–1.030)
Urobilinogen, Ur: 0.2 mg/dL (ref 0.2–1.0)
pH, UA: 6.5 (ref 5.0–7.5)

## 2019-07-11 LAB — MICROSCOPIC EXAMINATION

## 2019-07-12 LAB — CULTURE, URINE COMPREHENSIVE

## 2019-07-15 ENCOUNTER — Encounter: Payer: Self-pay | Admitting: Urology

## 2019-07-15 ENCOUNTER — Telehealth: Payer: Self-pay | Admitting: Urology

## 2019-07-15 ENCOUNTER — Telehealth: Payer: Self-pay | Admitting: *Deleted

## 2019-07-15 MED ORDER — NITROFURANTOIN MONOHYD MACRO 100 MG PO CAPS: 100.0000 mg | ORAL_CAPSULE | Freq: Two times a day (BID) | ORAL | 0 refills | Status: DC

## 2019-07-15 NOTE — Telephone Encounter (Signed)
Spoke with patient-still having ongoing frequency and burning. Denies fevers, body aches, or any other symptoms.

## 2019-07-15 NOTE — Telephone Encounter (Signed)
Pt LMOM and states that she is still having UTI symptoms and would like to know if something could be called in for her. She is awaiting UA results.

## 2019-07-15 NOTE — Telephone Encounter (Addendum)
Patient informed-verbalized understanding.  Sent to pharmacy as requested.  ----- Message from Hollice Espy, MD sent at 07/15/2019  2:18 PM EDT ----- Urine culture results just became available.  She grew staph epidermidis which generally contaminant from the vagina or labial skin.  Given her symptoms though, go ahead and treat with Macrobid 100 mg twice daily for 10 days to see if her symptoms improve.  Hollice Espy, MD

## 2019-07-15 NOTE — Telephone Encounter (Signed)
See results note. 

## 2019-07-25 ENCOUNTER — Other Ambulatory Visit: Payer: Self-pay | Admitting: Radiology

## 2019-07-25 DIAGNOSIS — N3941 Urge incontinence: Secondary | ICD-10-CM

## 2019-07-25 DIAGNOSIS — N3281 Overactive bladder: Secondary | ICD-10-CM

## 2019-07-25 DIAGNOSIS — Z01818 Encounter for other preprocedural examination: Secondary | ICD-10-CM

## 2019-07-25 MED ORDER — ONABOTULINUMTOXINA 100 UNITS IJ SOLR
100.0000 [IU] | INTRAMUSCULAR | Status: AC
Start: 2019-07-25 — End: 2019-07-26

## 2019-07-26 ENCOUNTER — Other Ambulatory Visit: Payer: Self-pay

## 2019-07-26 ENCOUNTER — Other Ambulatory Visit: Payer: BC Managed Care – PPO

## 2019-07-26 DIAGNOSIS — Z01818 Encounter for other preprocedural examination: Secondary | ICD-10-CM

## 2019-07-26 DIAGNOSIS — N3941 Urge incontinence: Secondary | ICD-10-CM

## 2019-07-26 DIAGNOSIS — N3281 Overactive bladder: Secondary | ICD-10-CM

## 2019-07-26 LAB — URINALYSIS, COMPLETE
Bilirubin, UA: NEGATIVE
Glucose, UA: NEGATIVE
Ketones, UA: NEGATIVE
Leukocytes,UA: NEGATIVE
Nitrite, UA: NEGATIVE
Protein,UA: NEGATIVE
RBC, UA: NEGATIVE
Specific Gravity, UA: 1.02 (ref 1.005–1.030)
Urobilinogen, Ur: 0.2 mg/dL (ref 0.2–1.0)
pH, UA: 7 (ref 5.0–7.5)

## 2019-07-26 LAB — MICROSCOPIC EXAMINATION

## 2019-07-28 ENCOUNTER — Encounter: Payer: Self-pay | Admitting: Urology

## 2019-07-29 LAB — CULTURE, URINE COMPREHENSIVE

## 2019-08-01 ENCOUNTER — Other Ambulatory Visit: Admission: RE | Admit: 2019-08-01 | Payer: BLUE CROSS/BLUE SHIELD | Source: Ambulatory Visit

## 2019-08-01 ENCOUNTER — Other Ambulatory Visit: Payer: Self-pay | Admitting: Radiology

## 2019-08-01 DIAGNOSIS — N3281 Overactive bladder: Secondary | ICD-10-CM

## 2019-08-01 DIAGNOSIS — N3941 Urge incontinence: Secondary | ICD-10-CM

## 2019-08-01 MED ORDER — CIPROFLOXACIN HCL 500 MG PO TABS: 500.0000 mg | ORAL_TABLET | Freq: Two times a day (BID) | ORAL | 0 refills | Status: DC

## 2019-08-05 ENCOUNTER — Encounter: Admission: RE | Payer: Self-pay | Source: Home / Self Care

## 2019-08-05 ENCOUNTER — Ambulatory Visit: Admission: RE | Admit: 2019-08-05 | Payer: BC Managed Care – PPO | Source: Home / Self Care | Admitting: Urology

## 2019-08-05 SURGERY — BOTOX INJECTION
Anesthesia: Choice

## 2019-08-16 ENCOUNTER — Other Ambulatory Visit: Payer: Self-pay

## 2019-08-16 DIAGNOSIS — N3281 Overactive bladder: Secondary | ICD-10-CM

## 2019-08-19 ENCOUNTER — Other Ambulatory Visit: Payer: Self-pay

## 2019-08-19 ENCOUNTER — Other Ambulatory Visit: Payer: Medicare Other

## 2019-08-19 DIAGNOSIS — N3281 Overactive bladder: Secondary | ICD-10-CM

## 2019-08-20 LAB — MICROSCOPIC EXAMINATION: RBC, Urine: NONE SEEN /hpf (ref 0–2)

## 2019-08-20 LAB — URINALYSIS, COMPLETE
Bilirubin, UA: NEGATIVE
Glucose, UA: NEGATIVE
Ketones, UA: NEGATIVE
Leukocytes,UA: NEGATIVE
Nitrite, UA: NEGATIVE
Protein,UA: NEGATIVE
RBC, UA: NEGATIVE
Specific Gravity, UA: 1.015 (ref 1.005–1.030)
Urobilinogen, Ur: 0.2 mg/dL (ref 0.2–1.0)
pH, UA: 6 (ref 5.0–7.5)

## 2019-08-21 LAB — CULTURE, URINE COMPREHENSIVE

## 2019-08-27 ENCOUNTER — Ambulatory Visit (INDEPENDENT_AMBULATORY_CARE_PROVIDER_SITE_OTHER): Payer: Medicare Other | Admitting: Urology

## 2019-08-27 ENCOUNTER — Encounter: Payer: Self-pay | Admitting: Urology

## 2019-08-27 ENCOUNTER — Other Ambulatory Visit: Payer: Self-pay

## 2019-08-27 VITALS — BP 181/75 | HR 74 | Ht 63.0 in | Wt 157.0 lb

## 2019-08-27 DIAGNOSIS — N3281 Overactive bladder: Secondary | ICD-10-CM | POA: Diagnosis not present

## 2019-08-27 MED ORDER — ONABOTULINUMTOXINA 100 UNITS IJ SOLR
100.0000 [IU] | Freq: Once | INTRAMUSCULAR | Status: AC
  Administered 2019-08-27: 100 [IU] via INTRAMUSCULAR

## 2019-08-27 MED ORDER — LIDOCAINE HCL 2 % IJ SOLN
60.0000 mL | Freq: Once | INTRAMUSCULAR | Status: AC
  Administered 2019-08-27: 12:00:00 1200 mg

## 2019-08-27 NOTE — Progress Notes (Signed)
Bladder Instillation  Due to overactive bladder patient is present today for a Bladder Instillation of lidocaine. Patient was cleaned and prepped in a sterile fashion with betadine and lidocaine 2% jelly was instilled into the urethra.  A 14FR catheter was inserted, urine return was noted 250ml, urine was yellow in color.  60 ml was instilled into the bladder. The catheter was then removed. Patient tolerated well, no complications were noted Patient held in bladder for 30 minutes prior to procedure starting.   Performed by: Verlene Mayer, [place]

## 2019-08-27 NOTE — Progress Notes (Signed)
   08/27/19  CC:  Chief Complaint  Patient presents with  . Cysto    Bladder BOTOX    HPI: 63 year old female who presents today for Botox injection.  Please see previous notes for details.  Preop urine culture was checked and is negative.  She has been taking periprocedural antibiotics.  Prior to today's procedure, her bladder was instilled with lidocaine and allowed to dwell for 30 minutes.  Please see CMA note.  Blood pressure (!) 181/75, pulse 74, height 5\' 3"  (1.6 m), weight 157 lb (71.2 kg). NED. A&Ox3.   No respiratory distress   Abd soft, NT, ND Normal external genitalia with patent urethral meatus  Cystoscopy Procedure Note  Patient identification was confirmed, informed consent was obtained, and patient was prepped using Betadine solution.  Lidocaine jelly was administered per urethral meatus.    Procedure: - Flexible cystoscope introduced, without any difficulty.   - Thorough search of the bladder revealed:    normal urethral meatus    normal urothelium    no stones    no ulcers     no tumors    no urethral polyps    no trabeculation  - Ureteral orifices were normal in position and appearance.  Using a flexible needle, 100 units of Botox was delivered into the detrusor muscle of the bladder in the form of approximately half a cc per injection, 2 rows of 10 on the posterior wall of the bladder as well as just beyond the trigone.  This was extremely well tolerated.  Post-Procedure: - Patient tolerated the procedure well  Assessment/ Plan:  1. Overactive bladder Procedure well-tolerated today Warning symptoms reviewed Patient is aware of risk of urinary retention. Periprocedural antibiotics given, will complete course tomorrow Advised medication take several weeks to take effect Her follow-up with this PA in about 3 months to reassess symptoms She is agreeable this plan - lidocaine (XYLOCAINE) 2 % (with pres) injection 1,200 mg - botulinum toxin Type  A (BOTOX) injection 100 Units     Return in about 3 months (around 11/27/2019) for PA recheck bladder symptoms.  Hollice Espy, MD

## 2019-12-03 ENCOUNTER — Other Ambulatory Visit: Payer: Self-pay | Admitting: Internal Medicine

## 2019-12-03 DIAGNOSIS — Z1231 Encounter for screening mammogram for malignant neoplasm of breast: Secondary | ICD-10-CM

## 2020-01-08 ENCOUNTER — Telehealth: Payer: Self-pay | Admitting: *Deleted

## 2020-01-08 NOTE — Telephone Encounter (Signed)
(  01/08/20) Could not leave message for pt to notify them that it is time to schedule annual low dose lung cancer screening CT scan. Will call back to verify information prior to the scan being scheduled SRW

## 2020-01-20 ENCOUNTER — Ambulatory Visit
Admission: RE | Admit: 2020-01-20 | Discharge: 2020-01-20 | Disposition: A | Discharge status: Home / Self Care | Payer: Medicare Other | Source: Ambulatory Visit | Attending: Internal Medicine | Admitting: Internal Medicine

## 2020-01-20 DIAGNOSIS — Z1231 Encounter for screening mammogram for malignant neoplasm of breast: Secondary | ICD-10-CM | POA: Insufficient documentation

## 2020-01-24 ENCOUNTER — Telehealth: Payer: Self-pay | Admitting: *Deleted

## 2020-01-24 DIAGNOSIS — Z87891 Personal history of nicotine dependence: Secondary | ICD-10-CM

## 2020-01-24 NOTE — Telephone Encounter (Signed)
(  01/24/20) Left message for pt to notify them that it is time to schedule annual low dose lung cancer screening CT scan. Instructed patient to call back to verify information prior to the scan being scheduled SRW     

## 2020-01-24 NOTE — Telephone Encounter (Signed)
(  01/24/20) Pt has been notified that lung cancer screening CT scan is due currently or will be in near future. Confirmed pt is within appropriate age range, and asymptomatic. Pt denies illness that would prevent curative treatment for lung cancer if found. Verified smoking history (Former Smoker since 2016, 1 ppd). Pt did receive 2nd COVID VX on (12/10/19) [CT to be scheduled approx. 4 weeks after vx date] Pt is agreeable for CT scan being scheduled, prefers an appt on a Mon. Wed or Fri, in the morning.  SRW

## 2020-01-27 NOTE — Addendum Note (Signed)
Addended by: Jonne Ply on: 01/27/2020 04:28 PM   Modules accepted: Orders

## 2020-01-27 NOTE — Telephone Encounter (Signed)
Smoking history: former, quit 07/29/15, 42 pack year

## 2020-01-28 NOTE — Telephone Encounter (Signed)
Unsuccessful attempt at calling patient to notify them of the low dose lung cancer screening CT scan appointment on 02/05/20 at 9:00 am.

## 2020-02-04 NOTE — Telephone Encounter (Signed)
Unsuccessful attempt at calling patient to notify them of the low dose lung cancer screening CT scan appointment on 02/05/20 at 9:00 am

## 2020-02-05 ENCOUNTER — Ambulatory Visit
Admission: RE | Admit: 2020-02-05 | Discharge: 2020-02-05 | Disposition: A | Discharge status: Home / Self Care | Payer: Medicare Other | Source: Ambulatory Visit | Attending: Nurse Practitioner | Admitting: Nurse Practitioner

## 2020-02-05 ENCOUNTER — Other Ambulatory Visit: Payer: Self-pay

## 2020-02-05 DIAGNOSIS — Z87891 Personal history of nicotine dependence: Secondary | ICD-10-CM | POA: Diagnosis present

## 2020-02-10 ENCOUNTER — Encounter: Payer: Self-pay | Admitting: *Deleted

## 2020-10-31 DIAGNOSIS — I700 Atherosclerosis of aorta: Secondary | ICD-10-CM

## 2020-10-31 HISTORY — DX: Atherosclerosis of aorta: I70.0

## 2020-12-21 ENCOUNTER — Other Ambulatory Visit: Payer: Self-pay | Admitting: Internal Medicine

## 2020-12-21 DIAGNOSIS — Z1231 Encounter for screening mammogram for malignant neoplasm of breast: Secondary | ICD-10-CM

## 2021-01-10 ENCOUNTER — Telehealth: Payer: Self-pay | Admitting: *Deleted

## 2021-01-10 NOTE — Telephone Encounter (Signed)
Left a voicemail to inform patient that it is time to schedule her annual lung cancer screening CT scan. Informed patient to call back to update her information and get her CT scan scheduled.

## 2021-01-11 ENCOUNTER — Other Ambulatory Visit: Payer: Self-pay | Admitting: *Deleted

## 2021-01-11 DIAGNOSIS — Z87891 Personal history of nicotine dependence: Secondary | ICD-10-CM

## 2021-01-11 DIAGNOSIS — Z122 Encounter for screening for malignant neoplasm of respiratory organs: Secondary | ICD-10-CM

## 2021-01-11 NOTE — Progress Notes (Signed)
Contacted and scheduled for annual lung screening scan. Patient is a former smoker, quit 07/29/15, 42 pack year history.

## 2021-01-20 ENCOUNTER — Other Ambulatory Visit: Payer: Self-pay

## 2021-01-20 ENCOUNTER — Ambulatory Visit
Admission: RE | Admit: 2021-01-20 | Discharge: 2021-01-20 | Disposition: A | Discharge status: Home / Self Care | Payer: Medicare Other | Source: Ambulatory Visit | Attending: Internal Medicine | Admitting: Internal Medicine

## 2021-01-20 DIAGNOSIS — Z1231 Encounter for screening mammogram for malignant neoplasm of breast: Secondary | ICD-10-CM | POA: Insufficient documentation

## 2021-02-17 ENCOUNTER — Ambulatory Visit
Admission: RE | Admit: 2021-02-17 | Discharge: 2021-02-17 | Disposition: A | Discharge status: Home / Self Care | Payer: Medicare Other | Source: Ambulatory Visit | Attending: Oncology | Admitting: Oncology

## 2021-02-17 ENCOUNTER — Other Ambulatory Visit: Payer: Self-pay

## 2021-02-17 DIAGNOSIS — Z87891 Personal history of nicotine dependence: Secondary | ICD-10-CM | POA: Insufficient documentation

## 2021-02-17 DIAGNOSIS — Z122 Encounter for screening for malignant neoplasm of respiratory organs: Secondary | ICD-10-CM | POA: Diagnosis present

## 2021-02-25 ENCOUNTER — Encounter: Payer: Self-pay | Admitting: *Deleted

## 2021-03-10 ENCOUNTER — Other Ambulatory Visit (HOSPITAL_COMMUNITY): Payer: Self-pay | Admitting: Cardiology

## 2021-03-10 ENCOUNTER — Other Ambulatory Visit: Payer: Self-pay | Admitting: Cardiology

## 2021-03-10 DIAGNOSIS — I2510 Atherosclerotic heart disease of native coronary artery without angina pectoris: Secondary | ICD-10-CM

## 2021-03-15 ENCOUNTER — Other Ambulatory Visit: Payer: Self-pay

## 2021-03-15 ENCOUNTER — Ambulatory Visit
Admission: RE | Admit: 2021-03-15 | Discharge: 2021-03-15 | Disposition: A | Discharge status: Home / Self Care | Payer: Medicare Other | Source: Ambulatory Visit | Attending: Cardiology | Admitting: Cardiology

## 2021-03-15 DIAGNOSIS — I2510 Atherosclerotic heart disease of native coronary artery without angina pectoris: Secondary | ICD-10-CM | POA: Insufficient documentation

## 2021-12-15 ENCOUNTER — Other Ambulatory Visit: Payer: Self-pay | Admitting: Internal Medicine

## 2021-12-15 DIAGNOSIS — Z1231 Encounter for screening mammogram for malignant neoplasm of breast: Secondary | ICD-10-CM

## 2022-01-25 ENCOUNTER — Ambulatory Visit
Admission: RE | Admit: 2022-01-25 | Discharge: 2022-01-25 | Disposition: A | Discharge status: Home / Self Care | Payer: Medicare Other | Source: Ambulatory Visit | Attending: Internal Medicine | Admitting: Internal Medicine

## 2022-01-25 ENCOUNTER — Other Ambulatory Visit: Payer: Self-pay

## 2022-01-25 DIAGNOSIS — Z1231 Encounter for screening mammogram for malignant neoplasm of breast: Secondary | ICD-10-CM | POA: Diagnosis not present

## 2022-03-10 ENCOUNTER — Other Ambulatory Visit: Payer: Self-pay

## 2022-03-10 DIAGNOSIS — Z122 Encounter for screening for malignant neoplasm of respiratory organs: Secondary | ICD-10-CM

## 2022-03-10 DIAGNOSIS — Z87891 Personal history of nicotine dependence: Secondary | ICD-10-CM

## 2022-03-21 ENCOUNTER — Ambulatory Visit
Admission: RE | Admit: 2022-03-21 | Discharge: 2022-03-21 | Disposition: A | Discharge status: Home / Self Care | Payer: Medicare Other | Source: Ambulatory Visit | Attending: Acute Care | Admitting: Acute Care

## 2022-03-21 DIAGNOSIS — Z87891 Personal history of nicotine dependence: Secondary | ICD-10-CM | POA: Insufficient documentation

## 2022-03-21 DIAGNOSIS — Z122 Encounter for screening for malignant neoplasm of respiratory organs: Secondary | ICD-10-CM | POA: Insufficient documentation

## 2022-03-24 ENCOUNTER — Telehealth: Payer: Self-pay | Admitting: Acute Care

## 2022-03-24 ENCOUNTER — Other Ambulatory Visit: Payer: Self-pay

## 2022-03-24 DIAGNOSIS — Z87891 Personal history of nicotine dependence: Secondary | ICD-10-CM

## 2022-03-24 DIAGNOSIS — Z122 Encounter for screening for malignant neoplasm of respiratory organs: Secondary | ICD-10-CM

## 2022-03-24 NOTE — Telephone Encounter (Signed)
Spoke with patient by phone to review results of LDCT.  No findings suspicious for lung cancer.  Noted were atherosclerosis, emphysema and stable adrenal nodule, as previous.  New finding of thyroid nodule. Patient has no know history of thyroid disease.  Discussed she may need follow up on this.  Verified PCP is Dr. Edwina Barth and patient advised results will be routed to PCP with a note for his review/recommendation for thyroid nodule follow up.  Patient advised to call PCP office if she has not heard anything in two weeks.  Patient acknowledged understanding and had no questions.  Placed order for LDCT for 2024 yearly scan.

## 2022-04-14 ENCOUNTER — Other Ambulatory Visit: Payer: Self-pay | Admitting: Internal Medicine

## 2022-04-14 DIAGNOSIS — E041 Nontoxic single thyroid nodule: Secondary | ICD-10-CM

## 2022-04-25 ENCOUNTER — Ambulatory Visit
Admission: RE | Admit: 2022-04-25 | Discharge: 2022-04-25 | Disposition: A | Discharge status: Home / Self Care | Payer: Medicare Other | Source: Ambulatory Visit | Attending: Internal Medicine | Admitting: Internal Medicine

## 2022-04-25 DIAGNOSIS — E041 Nontoxic single thyroid nodule: Secondary | ICD-10-CM | POA: Insufficient documentation

## 2022-12-14 ENCOUNTER — Other Ambulatory Visit: Payer: Self-pay

## 2022-12-14 DIAGNOSIS — Z1231 Encounter for screening mammogram for malignant neoplasm of breast: Secondary | ICD-10-CM

## 2022-12-19 ENCOUNTER — Encounter: Payer: Self-pay | Admitting: *Deleted

## 2023-01-27 ENCOUNTER — Ambulatory Visit
Admission: RE | Admit: 2023-01-27 | Discharge: 2023-01-27 | Disposition: A | Discharge status: Home / Self Care | Payer: Medicare Other | Source: Ambulatory Visit | Attending: Internal Medicine | Admitting: Internal Medicine

## 2023-01-27 DIAGNOSIS — Z1231 Encounter for screening mammogram for malignant neoplasm of breast: Secondary | ICD-10-CM | POA: Diagnosis present

## 2023-01-30 NOTE — Progress Notes (Unsigned)
[name] New Patient Evaluation and Consultation  Referring Provider: Baxter Hire, MD PCP: Baxter Hire, MD Date of Service: 01/31/2023  SUBJECTIVE Chief Complaint: No chief complaint on file.  History of Present Illness: Mariah Cooper is a 67 y.o. {ED SANE [id number] female seen in consultation at the request of Dr. Edwina Barth for evaluation of ***.    ***Review of records significant for: ***  Urinary Symptoms: {urine leakage?:24754} Leaks *** time(s) per {days/wks/mos/yrs:310907}.  Pad use: {NUMBERS 1-10:18281} {pad option:24752} per day.   She {ACTION; IS/IS [id number] bothered by her UI symptoms.  Day time voids ***.  Nocturia: *** times per night to void. Voiding dysfunction: she {empties:24755} her bladder well.  {DOES NOT does:27190::"does not"} use a catheter to empty bladder.  When urinating, she feels {urine symptoms:24756} Drinks: *** per day  UTIs: {NUMBERS 1-10:18281} UTI's in the last year.   {ACTIONS;DENIES/REPORTS:21021675::"Denies"} history of {urologic concerns:24757}  Pelvic Organ Prolapse Symptoms:                  She {denies/ admits to:24761} a feeling of a bulge the vaginal area. It has been present for {NUMBER 1-10:22536} {days/wks/mos/yrs:310907}.  She {denies/ admits to:24761} seeing a bulge.  This bulge {ACTION; IS/IS [id number] bothersome.  Bowel Symptom: Bowel movements: *** time(s) per {Time; day/week/month:13537} Stool consistency: {stool consistency:24758} Straining: {yes/no:19897}.  Splinting: {yes/no:19897}.  Incomplete evacuation: {yes/no:19897}.  She {denies/ admits to:24761} accidental bowel leakage / fecal incontinence  Occurs: *** time(s) per {Time; day/week/month:13537}  Consistency with leakage: {stool consistency:24758} Bowel regimen: {bowel regimen:24759} Last colonoscopy: Date ***, Results ***  Sexual Function Sexually active: {yes/no:19897}.  Sexual orientation: {Sexual  [phone]} Pain with sex: {pain with sex:24762}  Pelvic Pain {denies/ admits to:24761} pelvic pain Location: *** Pain occurs: *** Prior pain treatment: *** Improved by: *** Worsened by: ***   Past Medical History:  Past Medical History:  Diagnosis Date   Anemia    Complication of anesthesia    HARD TIME WAKING UP-PT DOES NOT WANT GENERAL ANESTHESIA   GERD (gastroesophageal reflux disease)    WILL OCCASSIONALLY TAKE MAALOX   History of hiatal hernia      Past Surgical History:   Past Surgical History:  Procedure Laterality Date   ABDOMINAL HYSTERECTOMY     BLADDER SUSPENSION     BREAST CYST ASPIRATION Right 01/15/2018   Neg   ECTOPIC PREGNANCY SURGERY     OPEN REDUCTION INTERNAL FIXATION (ORIF) DISTAL RADIAL FRACTURE Left 08/31/2015   Procedure: OPEN REDUCTION INTERNAL FIXATION (ORIF) DISTAL RADIAL FRACTURE;  Surgeon: Earnestine Leys, MD;  Location: [facility] ORS;  Service: Orthopedics;  Laterality: Left;     Past OB/GYN History: G{NUMBERS 1-10:18281} P{NUMBERS 1-10:18281} Vaginal deliveries: ***,  Forceps/ Vacuum deliveries: ***, Cesarean section: *** Menopausal: {menopausal:24763} Contraception: ***. Last pap smear was ***.  Any history of abnormal pap smears: {yes/no:19897}.   Medications: She has a current medication list which includes the following prescription(s): alendronate, calcium carbonate, cholecalciferol, ciprofloxacin, magnesium, and multivitamin with minerals.   Allergies: Patient is allergic to sulfa antibiotics, ivp dye [iodinated contrast media], and tape.   Social History:  Social History   Tobacco Use   Smoking status: Former    Packs/day: 1.00    Years: 42.00    Additional pack years: 0.00    Total pack years: 42.00    Types: Cigarettes    Quit date: 07/29/2015    Years since quitting: 7.5   Smokeless tobacco: Never  Substance Use Topics   Alcohol use:  No   Drug use: No    Relationship status: {relationship  status:24764} She lives with ***.   She {ACTION; IS/IS [id number] employed ***. Regular exercise: {Yes/No:304960894} History of abuse: {Yes/No:304960894}  Family History:   Family History  Problem Relation Age of Onset   Breast cancer Neg Hx      Review of Systems: ROS   OBJECTIVE Physical Exam: There were no vitals filed for this visit.  Physical Exam   GU / Detailed Urogynecologic Evaluation:  Pelvic Exam: Normal external female genitalia; Bartholin's and Skene's glands normal in appearance; urethral meatus normal in appearance, no urethral masses or discharge.   CST: {gen negative/positive:315881}  Reflexes: bulbocavernosis {DESC; PRESENT/NOT PRESENT:21021351}, anocutaneous {DESC; PRESENT/NOT PRESENT:21021351} ***bilaterally.  Speculum exam reveals normal vaginal mucosa {With/Without:20273} atrophy. Cervix {exam; gyn cervix:30847}. Uterus {exam; pelvic uterus:30849}. Adnexa {exam; adnexa:12223}.    s/p hysterectomy: Speculum exam reveals normal vaginal mucosa {With/Without:20273}  atrophy and normal vaginal cuff.  Adnexa {exam; adnexa:12223}.    With apex supported, anterior compartment defect was {reduced:24765}  Pelvic floor strength {Roman # I-V:19040}/V, puborectalis {Roman # I-V:19040}/V external anal sphincter {Roman # I-V:19040}/V  Pelvic floor musculature: Right levator {Tender/Non-tender:20250}, Right obturator {Tender/Non-tender:20250}, Left levator {Tender/Non-tender:20250}, Left obturator {Tender/Non-tender:20250}  POP-Q:   POP-Q                                               Aa                                               Ba                                                 C                                                Gh                                               Pb                                               tvl                                                Ap                                               Bp  D      Rectal Exam:  Normal sphincter tone, {rectocele:24766} distal rectocele, enterocoele {DESC; PRESENT/NOT PRESENT:21021351}, no rectal masses, {sign of:24767} dyssynergia when asking the patient to bear down.  Post-Void Residual (PVR) by Bladder Scan: In order to evaluate bladder emptying, we discussed obtaining a postvoid residual and she agreed to this procedure.  Procedure: The ultrasound unit was placed on the patient's abdomen in the suprapubic region after the patient had voided. A PVR of *** ml was obtained by bladder scan.  Laboratory Results: @ENCLABS @   ***I visualized the urine specimen, noting the specimen to be {urine color:24768}  ASSESSMENT AND PLAN Ms. Robbins-Davis is a 67 y.o. with: No diagnosis found.    Jaquita Folds, MD   Medical Decision Making:  - Reviewed/ ordered a clinical laboratory test - Reviewed/ ordered a radiologic study - Reviewed/ ordered medicine test - Decision to obtain old records - Discussion of management of or test interpretation with an external physician / other healthcare professional  - Assessment requiring independent historian - Review and summation of prior records - Independent review of image, tracing or specimen

## 2023-01-31 ENCOUNTER — Ambulatory Visit: Payer: Medicare Other | Admitting: Obstetrics and Gynecology

## 2023-01-31 ENCOUNTER — Encounter: Payer: Self-pay | Admitting: Obstetrics and Gynecology

## 2023-01-31 VITALS — BP 159/74 | HR 67 | Ht 62.5 in | Wt 150.0 lb

## 2023-01-31 DIAGNOSIS — R339 Retention of urine, unspecified: Secondary | ICD-10-CM

## 2023-01-31 DIAGNOSIS — R350 Frequency of micturition: Secondary | ICD-10-CM | POA: Diagnosis not present

## 2023-01-31 LAB — POCT URINALYSIS DIPSTICK
Bilirubin, UA: NEGATIVE
Glucose, UA: NEGATIVE
Ketones, UA: NEGATIVE
Leukocytes, UA: NEGATIVE
Nitrite, UA: NEGATIVE
Protein, UA: NEGATIVE
Spec Grav, UA: 1.01 (ref 1.010–1.025)
Urobilinogen, UA: 0.2 E.U./dL
pH, UA: 6.5 (ref 5.0–8.0)

## 2023-01-31 NOTE — Patient Instructions (Addendum)
Self catheterize 4-5 times per day: in the morning when you wake up, around breakfast, around lunch, around dinner and before bed. Measure how much you urinate in the hat, then measure how much you get out with the catheter.    URODYNAMICS (UDS) TEST INFORMATION  IMPORTANT: Please try to arrive with a comfortably full bladder!    What is UDS? Urodynamics is a bladder test used to evaluate how your bladder and urethra (tube you urinate out of) work to help find out the cause of your bladder symptoms and evaluate your bladder function in order to make the best treatment plan for you.   What to expect? A nurse will perform the test and will be with you during the entire exam. First we will have to empty your bladder on a special toilet.  After you have emptied your bladder, very small catheters (plastic tubing) will be placed into your bladder and into your vagina (or rectum). These special small catheters measure pressure to help measure your bladder function.  Your bladder will be gently filled with water and you will be asked to cough and strain at several different points during the test.   You will then be asked to empty your bladder in the special toilet with the catheters in place. Most patients can urinate (pee) easily with the catheters in place since the catheters are so small. In total this procedure lasts about 45 minutes to 1 hour.  After your test is completed, you will return (or possibly be seen the same day) to review the results, talk about treatment options and make a plan moving forward.

## 2023-02-07 ENCOUNTER — Ambulatory Visit
Admission: RE | Admit: 2023-02-07 | Discharge: 2023-02-07 | Disposition: A | Discharge status: Home / Self Care | Payer: Medicare Other | Source: Ambulatory Visit | Attending: Obstetrics and Gynecology | Admitting: Obstetrics and Gynecology

## 2023-02-07 DIAGNOSIS — R339 Retention of urine, unspecified: Secondary | ICD-10-CM | POA: Insufficient documentation

## 2023-02-07 DIAGNOSIS — R93422 Abnormal radiologic findings on diagnostic imaging of left kidney: Secondary | ICD-10-CM | POA: Diagnosis not present

## 2023-02-13 NOTE — Progress Notes (Unsigned)
[facility] Urodynamics Procedure  Referring Physician: Gracelyn Nurse, MD Date of Procedure: 02/14/2023  Mariah Cooper is a 67 y.o. female who presents for urodynamic evaluation. Indication(s) for study: Incomplete Bladder Emptying   Vital Signs: There were no vitals taken for this visit.  Laboratory Results: A {clean catch/ OTLX:72620} urine specimen revealed:  POC urine:    Voiding Diary: The patient voided {NUMBERS; 1-20:18551} times per day. Voided volumes ranged from *** mL to *** mL, with an average of *** mL.  Intervals between voids ranged from *** hr to ***hr with an average interval between voids of *** hr.  Intake per day ranged from ***mL to ***mL.  Output ranged from ***mL to ***mL.  She had *** incontinence episodes per day (*** UUI, *** SUI) and *** episodes of nocturia.  She drinks: *** oz of caffeinated beverages, *** oz of juice, and *** oz of alcohol per day.  Procedure Timeout:  The correct patient was verified and the correct procedure was verified. The patient was in the correct position and safety precautions were reviewed based on at the patient's history.  Urodynamic Procedure A 29F dual lumen urodynamics catheter was placed under sterile conditions into the patient's bladder. A 29F catheter was placed into the {vagina/ rectum:24786} in order to measure abdominal pressure. EMG patches were placed in the appropriate position.  All connections were confirmed and calibrations/adjusted made. Saline was instilled into the bladder through the dual lumen catheters.  Cough/valsalva pressures were measured periodically during filling.  Patient was allowed to void.  The bladder was then emptied of its residual.  UROFLOW: Revealed a Qmax of *** mL/sec.  She voided *** mL and had a residual of *** mL.  It was a {uroflow:24789} pattern and represented normal habits ***though interpretation limited due to low voided volume.***  CMG: This was performed  with sterile water in the sitting position at a fill rate of 30*** mL/min.    First sensation of fullness was *** mLs,  First urge was *** mLs,  Strong urge was *** mLs and  Capacity was *** mLs  Stress incontinence {WAS/WAS [phone]::"was not"} demonstrated {highest/ lowest:24787} {Desc; negative/positive:13464} CLPP was *** cmH20 at *** ml. {highest/ lowest:24787} {Desc; negative/positive:13464} VLPP was *** cmH20at *** ml. {highest/ lowest:24787} {Desc; negative/positive:13464} Barrier CLPP was *** cmH20 at *** ml. {highest/ lowest:24787} {Desc; negative/positive:13464} Barrier VLPP was *** cmH20 at *** ml.  Detrusor function was {Desc; normal/underactive/overactive:13463}, {With/no:32556} phasic contractions seen.  The first occurred at *** mL to *** cm of water and {WAS/WAS [phone]::"was not"} associated with {urge/ BTDH:74163}.  Compliance:  ***. End fill detrusor pressure was ***cmH20.  Calculated compliance was ***AG/[id number]  UPP: MUCP {With/without:5700} barrier reduction was *** cm of water.    MICTURITION STUDY: Voiding was performed {reduction:24791} in the sitting position.  Pdet at Qmax was *** cm of water.  Qmax was *** mL/sec.  It was a {uroflow:24789} pattern.  She voided *** mL and had a residual of *** mL.  It was a volitional void, sustained detrusor contraction was {DESC; PRESENT/NOT PRESENT:21021351} and abdominal straining was {DESC; PRESENT/NOT PRESENT:21021351}  EMG: This was performed with patches.  She had voluntary contractions, recruitment with fill was {DESC; PRESENT/NOT PRESENT:21021351} and urethral sphincter was {relaxed:24792} with void.  The details of the procedure with the study tracings have been scanned into EPIC.   Urodynamic Impression:  1. Sensation was {DESC; NORMAL/REDUCED/ABSENT/INCREASED:23133}; capacity was {DESC; NORMAL/REDUCED/ABSENT/INCREASED:23133} 2. Stress Incontinence {WAS/WAS [phone]::"was not"} demonstrated at  {OEH:21224}  pressures; 3. Detrusor Overactivity {WAS/WAS NOT:[phone]::"was not"} demonstrated {With-without:32421} leakage. 4. Emptying was {dysfunctional:24794} with a {elevated:24795} PVR, a sustained detrusor contraction {DESC; PRESENT/NOT PRESENT:21021351},  abdominal straining {DESC; PRESENT/NOT PRESENT:21021351}, {dyssynergic:24796} urethral sphincter activity on EMG.  Plan: - The patient will follow up  to discuss the findings and treatment options.

## 2023-02-14 ENCOUNTER — Encounter: Payer: Self-pay | Admitting: Obstetrics and Gynecology

## 2023-02-14 ENCOUNTER — Ambulatory Visit (INDEPENDENT_AMBULATORY_CARE_PROVIDER_SITE_OTHER): Payer: Medicare Other | Admitting: Obstetrics and Gynecology

## 2023-02-14 ENCOUNTER — Other Ambulatory Visit (HOSPITAL_COMMUNITY)
Admission: RE | Admit: 2023-02-14 | Discharge: 2023-02-14 | Disposition: A | Discharge status: Home / Self Care | Payer: Medicare Other | Source: Other Acute Inpatient Hospital | Attending: Obstetrics and Gynecology | Admitting: Obstetrics and Gynecology

## 2023-02-14 VITALS — BP 129/79 | HR 76

## 2023-02-14 DIAGNOSIS — R82998 Other abnormal findings in urine: Secondary | ICD-10-CM | POA: Diagnosis not present

## 2023-02-14 DIAGNOSIS — R350 Frequency of micturition: Secondary | ICD-10-CM | POA: Insufficient documentation

## 2023-02-14 DIAGNOSIS — R319 Hematuria, unspecified: Secondary | ICD-10-CM | POA: Diagnosis not present

## 2023-02-14 DIAGNOSIS — R339 Retention of urine, unspecified: Secondary | ICD-10-CM

## 2023-02-14 DIAGNOSIS — N289 Disorder of kidney and ureter, unspecified: Secondary | ICD-10-CM | POA: Diagnosis not present

## 2023-02-14 LAB — URINALYSIS, ROUTINE W REFLEX MICROSCOPIC
Bilirubin Urine: NEGATIVE
Glucose, UA: NEGATIVE mg/dL
Ketones, ur: NEGATIVE mg/dL
Nitrite: POSITIVE — AB
Protein, ur: 30 mg/dL — AB
Specific Gravity, Urine: 1.006 (ref 1.005–1.030)
WBC, UA: >50 WBC/hpf (ref 0–5)
pH: 6 (ref 5.0–8.0)

## 2023-02-14 LAB — POCT URINALYSIS DIPSTICK
Bilirubin, UA: NEGATIVE
Glucose, UA: NEGATIVE
Ketones, UA: NEGATIVE
Nitrite, UA: POSITIVE
Protein, UA: POSITIVE — AB
Spec Grav, UA: 1.015 (ref 1.010–1.025)
Urobilinogen, UA: 0.2 E.U./dL
pH, UA: 6.5 (ref 5.0–8.0)

## 2023-02-14 MED ORDER — NITROFURANTOIN MONOHYD MACRO 100 MG PO CAPS: 100.0000 mg | ORAL_CAPSULE | Freq: Two times a day (BID) | ORAL | 0 refills | Status: AC

## 2023-02-14 NOTE — Patient Instructions (Signed)
Call and Schedule MRI  Take the antibiotic for your UTI   We will reschedule you for Urodynamics

## 2023-02-16 LAB — URINE CULTURE: Culture: >=100000 — AB

## 2023-02-16 NOTE — Progress Notes (Addendum)
Please call and inform patient she has a UTI and that the antibiotics she is taking are appropriate to finish.

## 2023-02-16 NOTE — Progress Notes (Signed)
Patient has been notified

## 2023-02-21 ENCOUNTER — Ambulatory Visit
Admission: RE | Admit: 2023-02-21 | Discharge: 2023-02-21 | Disposition: A | Discharge status: Home / Self Care | Payer: Medicare Other | Source: Ambulatory Visit | Attending: Obstetrics and Gynecology | Admitting: Obstetrics and Gynecology

## 2023-02-21 DIAGNOSIS — N289 Disorder of kidney and ureter, unspecified: Secondary | ICD-10-CM | POA: Insufficient documentation

## 2023-02-21 MED ORDER — GADOBUTROL 1 MMOL/ML IV SOLN
7.0000 mL | Freq: Once | INTRAVENOUS | Status: AC | PRN
  Administered 2023-02-21: 7 mL via INTRAVENOUS

## 2023-02-23 ENCOUNTER — Encounter: Payer: Self-pay | Admitting: Obstetrics and Gynecology

## 2023-02-23 DIAGNOSIS — N390 Urinary tract infection, site not specified: Secondary | ICD-10-CM

## 2023-02-23 DIAGNOSIS — R82998 Other abnormal findings in urine: Secondary | ICD-10-CM

## 2023-02-23 MED ORDER — NITROFURANTOIN MONOHYD MACRO 100 MG PO CAPS: 100.0000 mg | ORAL_CAPSULE | Freq: Two times a day (BID) | ORAL | 0 refills | Status: AC

## 2023-02-23 NOTE — Telephone Encounter (Signed)
Patient called reporting she still is having symptoms of a UTI and requesting a longer course of antibiotics. As she is scheduled to have urodynamics soon, she needs to be clear of UTI. Will re-order antibiotics to make sure UTI is cleared.

## 2023-03-06 ENCOUNTER — Telehealth: Payer: Self-pay

## 2023-03-06 ENCOUNTER — Encounter: Payer: Self-pay | Admitting: Obstetrics and Gynecology

## 2023-03-06 MED ORDER — AMOXICILLIN-POT CLAVULANATE 875-125 MG PO TABS: 1.0000 | ORAL_TABLET | Freq: Two times a day (BID) | ORAL | 0 refills | Status: DC

## 2023-03-14 NOTE — Telephone Encounter (Signed)
Erroneous entry

## 2023-03-20 NOTE — Progress Notes (Addendum)
[facility] Urodynamics Procedure  Referring Physician: Gracelyn Nurse, MD Date of Procedure: 03/21/2023  Mariah Cooper is a 67 y.o. female who presents for urodynamic evaluation. Indication(s) for study: Incomplete bladder emptying, urinary urgency, and nocturia  Vital Signs: BP (!) 145/81   Pulse 79   Laboratory Results: A clean catch urine specimen revealed:  POC urine: Negative for all components   Voiding Diary: Not done  Procedure Timeout:  The correct patient was verified and the correct procedure was verified. The patient was in the correct position and safety precautions were reviewed based on at the patient's history.  Urodynamic Procedure A 13F dual lumen urodynamics catheter was placed under sterile conditions into the patient's bladder. A 13F catheter was placed into the rectum in order to measure abdominal pressure. EMG patches were placed in the appropriate position.  All connections were confirmed and calibrations/adjusted made. Saline was instilled into the bladder through the dual lumen catheters.  Cough/valsalva pressures were measured periodically during filling.  Patient was allowed to void.  The bladder was then emptied of its residual.  UROFLOW: Revealed a Qmax of  12.3 mL/sec.  She voided 194 mL and had a residual of 175 mL.  It was a interrupted pattern and represented normal habits   CMG: This was performed with sterile water in the sitting position at a fill rate of 30 mL/min.    First sensation of fullness was 69 mLs,  First urge was 92 mLs,  Strong urge was 137 mLs and  Capacity was 413 mLs  Stress incontinence was not demonstrated Highest negative Barrier cough peak pressure was 164 cmH20 at 200 ml. Highest negative Barrier valsalva peak pressure was 134 cmH20 at 327 ml.  Detrusor function was normal until nearing capacity, with phasic contractions seen near capacity.  The first occurred at 294 mL to 3.5 cm of water and  was associated with urge.  Compliance:  Normal. End fill detrusor pressure was 0cmH20.    UPP: MUCP with barrier reduction was 45 cm of water.    MICTURITION STUDY: Voiding was performed with reduction using scopettes in the sitting position.  Pdet at Qmax was 46 cm of water.  Qmax was 9.4 mL/sec.  It was a normal pattern.  She voided 312.6 mL and had a residual of 100 mL.  It was a volitional void, sustained detrusor contraction was present and abdominal straining was present  EMG: This was performed with patches.  She had voluntary contractions, recruitment with fill was present and urethral sphincter was relaxed with void.  The details of the procedure with the study tracings have been scanned into EPIC.   Urodynamic Impression:  1. Sensation was increased; capacity was normal 2. Stress Incontinence was not demonstrated at normal pressures; 3. Detrusor Overactivity was demonstrated without leakage. 4. Emptying was dysfunctional with an initial mildly elevated PVR of and a micturition study PVR of , a sustained detrusor contraction present,  abdominal straining present, normal urethral sphincter activity on EMG.  Plan: - The patient will follow up  to discuss the findings and treatment options.

## 2023-03-21 ENCOUNTER — Ambulatory Visit (INDEPENDENT_AMBULATORY_CARE_PROVIDER_SITE_OTHER): Payer: Medicare Other | Admitting: Obstetrics and Gynecology

## 2023-03-21 ENCOUNTER — Encounter: Payer: Self-pay | Admitting: Obstetrics and Gynecology

## 2023-03-21 VITALS — BP 145/81 | HR 79

## 2023-03-21 DIAGNOSIS — R350 Frequency of micturition: Secondary | ICD-10-CM

## 2023-03-21 DIAGNOSIS — R339 Retention of urine, unspecified: Secondary | ICD-10-CM | POA: Diagnosis not present

## 2023-03-21 LAB — POCT URINALYSIS DIPSTICK
Bilirubin, UA: NEGATIVE
Blood, UA: NEGATIVE
Glucose, UA: NEGATIVE
Ketones, UA: NEGATIVE
Leukocytes, UA: NEGATIVE
Nitrite, UA: NEGATIVE
Protein, UA: NEGATIVE
Spec Grav, UA: 1.02 (ref 1.010–1.025)
Urobilinogen, UA: 0.2 E.U./dL
pH, UA: 7 (ref 5.0–8.0)

## 2023-03-21 NOTE — Patient Instructions (Signed)

## 2023-03-23 ENCOUNTER — Ambulatory Visit
Admission: RE | Admit: 2023-03-23 | Discharge: 2023-03-23 | Disposition: A | Discharge status: Home / Self Care | Payer: Medicare Other | Source: Ambulatory Visit | Attending: Internal Medicine | Admitting: Internal Medicine

## 2023-03-23 DIAGNOSIS — Z87891 Personal history of nicotine dependence: Secondary | ICD-10-CM | POA: Diagnosis present

## 2023-03-23 DIAGNOSIS — Z122 Encounter for screening for malignant neoplasm of respiratory organs: Secondary | ICD-10-CM | POA: Diagnosis present

## 2023-03-28 ENCOUNTER — Other Ambulatory Visit: Payer: Self-pay | Admitting: Acute Care

## 2023-03-28 DIAGNOSIS — Z122 Encounter for screening for malignant neoplasm of respiratory organs: Secondary | ICD-10-CM

## 2023-03-28 DIAGNOSIS — Z87891 Personal history of nicotine dependence: Secondary | ICD-10-CM

## 2023-06-06 ENCOUNTER — Ambulatory Visit: Payer: Medicare Other | Admitting: Obstetrics and Gynecology

## 2023-06-06 ENCOUNTER — Encounter: Payer: Self-pay | Admitting: Obstetrics and Gynecology

## 2023-06-06 VITALS — BP 132/80 | HR 79

## 2023-06-06 DIAGNOSIS — R350 Frequency of micturition: Secondary | ICD-10-CM

## 2023-06-06 DIAGNOSIS — R339 Retention of urine, unspecified: Secondary | ICD-10-CM

## 2023-06-06 NOTE — Progress Notes (Signed)
Urogynecology Return Visit  SUBJECTIVE  History of Present Illness: Mariah Cooper is a 67 y.o. female seen in follow-up for urinary frequency.   At initial visit, had incomplete bladder emptying with PVR of . She underwent urodynamic testing:  Urodynamic Impression:  1. Sensation was increased; capacity was normal 2. Stress Incontinence was not demonstrated at normal pressures; 3. Detrusor Overactivity was demonstrated without leakage. 4. Emptying was dysfunctional with an initial mildly elevated PVR of and a micturition study PVR of , a sustained detrusor contraction present,  abdominal straining present, normal urethral sphincter activity on EMG.  Had renal US which showed no hydronephrosis but did show some cysts.MRI abdomen and pelvis 02/21/23 showed:  1. Simple, benign bilateral renal cortical cysts, including a cyst of the superior pole of the right kidney measuring 1.1 cm and in the midportion of the left kidney measuring 1.4 cm. No solid component or contrast enhancement associated with these lesions. No further follow-up or characterization is required for these benign Bosniak category I cysts.  - previously underwent Intravesical botox injections with [facility]. Last injection was 08/2019. She does not feel that this helped.  - Failed vesicare, myrbetriq and toviaz - History of mesh sling for SUI in 2013 by Dr Logan Bores  Past Medical History: Patient  has a past medical history of Anemia, Complication of anesthesia, GERD (gastroesophageal reflux disease), and History of hiatal hernia.   Past Surgical History: She  has a past surgical history that includes Laparoscopic hysterectomy; Ectopic pregnancy surgery; Incontinence surgery; Open reduction internal fixation (orif) distal radial fracture (Left, 08/31/2015); Breast cyst aspiration (Right, 01/15/2018); and Vaginal prolapse repair.   Medications: She has a current  medication list which includes the following prescription(s): calcium carbonate, cholecalciferol, magnesium, and multivitamin with minerals.   Allergies: Patient is allergic to sulfa antibiotics, ivp dye [iodinated contrast media], and tape.   Social History: Patient  reports that she quit smoking about 7 years ago. Her smoking use included cigarettes. She started smoking about 49 years ago. She has a 42 pack-year smoking history. She has never used smokeless tobacco. She reports that she does not drink alcohol and does not use drugs.      OBJECTIVE     Physical Exam: Vitals:   06/06/23 0811  BP: 132/80  Pulse: 79   Gen: No apparent distress, A&O x 3.  Detailed Urogynecologic Evaluation:  Deferred.    ASSESSMENT AND PLAN    Mariah Cooper is a 67 y.o. with:  1. Urinary frequency   2. Incomplete bladder emptying    - Has failed OAB medications and botox. We discussed that she potentially has intermittent incomplete emptying which is contributing to her symptoms. Therefore we discussed sacral neuromodualation which can improve both bladder emptying and urgency/ frequency.  - =It requires a test phase, and documentation of bladder function via diary. After a successful test period, a permanent wire and generator are placed in the OR. = The goal of this therapy is at least a 50% improvement in symptoms. It is NOT realistic to expect a 100% cure.  We reviewed the fact that about 30% of patients fail the test phase and are not candidates for permanent generator placement. There are two companies that provide this therapy: [employer] and [employer]. Both are MRI compatible. She prefers the [employer] device.  -  For all procedures, we discussed risks of bleeding, infection, damage to surrounding organs including bowel, bladder, blood vessels, ureters and nerves, need for  further surgery, risk of postoperative urinary incontinence or retention with need to catheterize, recurrent prolapse, numbness  and weakness at any body site, buttock pain, and the rarer risks of blood clot, heart attack, pneumonia, death.    Will return for PNE procedure trial. She will keep a 3 day bladder diary before that visit.   Marguerita Beards, MD

## 2023-07-13 ENCOUNTER — Encounter: Payer: Self-pay | Admitting: Gastroenterology

## 2023-07-14 ENCOUNTER — Ambulatory Visit: Payer: Medicare Other | Admitting: Anesthesiology

## 2023-07-14 ENCOUNTER — Encounter
Admission: RE | Disposition: A | Discharge status: Home / Self Care | Payer: Self-pay | Source: Home / Self Care | Attending: Gastroenterology

## 2023-07-14 ENCOUNTER — Ambulatory Visit
Admission: RE | Admit: 2023-07-14 | Discharge: 2023-07-14 | Disposition: A | Discharge status: Home / Self Care | Payer: Medicare Other | Attending: Gastroenterology | Admitting: Gastroenterology

## 2023-07-14 ENCOUNTER — Encounter: Payer: Self-pay | Admitting: Gastroenterology

## 2023-07-14 DIAGNOSIS — Z1211 Encounter for screening for malignant neoplasm of colon: Secondary | ICD-10-CM | POA: Diagnosis present

## 2023-07-14 DIAGNOSIS — Z86010 Personal history of colonic polyps: Secondary | ICD-10-CM | POA: Insufficient documentation

## 2023-07-14 DIAGNOSIS — K5730 Diverticulosis of large intestine without perforation or abscess without bleeding: Secondary | ICD-10-CM | POA: Diagnosis not present

## 2023-07-14 DIAGNOSIS — K219 Gastro-esophageal reflux disease without esophagitis: Secondary | ICD-10-CM | POA: Insufficient documentation

## 2023-07-14 DIAGNOSIS — Q438 Other specified congenital malformations of intestine: Secondary | ICD-10-CM | POA: Insufficient documentation

## 2023-07-14 DIAGNOSIS — Z87891 Personal history of nicotine dependence: Secondary | ICD-10-CM | POA: Diagnosis not present

## 2023-07-14 DIAGNOSIS — K621 Rectal polyp: Secondary | ICD-10-CM | POA: Diagnosis not present

## 2023-07-14 DIAGNOSIS — K5939 Other megacolon: Secondary | ICD-10-CM | POA: Diagnosis not present

## 2023-07-14 HISTORY — PX: POLYPECTOMY: SHX5525

## 2023-07-14 HISTORY — PX: COLONOSCOPY: SHX5424

## 2023-07-14 HISTORY — PX: SUBMUCOSAL TATTOO INJECTION: SHX6856

## 2023-07-14 SURGERY — COLONOSCOPY
Anesthesia: General

## 2023-07-14 MED ORDER — PROPOFOL 500 MG/50ML IV EMUL
INTRAVENOUS | Status: DC | PRN
  Administered 2023-07-14: 120 ug/kg/min via INTRAVENOUS

## 2023-07-14 MED ORDER — SODIUM CHLORIDE 0.9 % IV SOLN
INTRAVENOUS | Status: DC
  Administered 2023-07-14: 20 mL/h via INTRAVENOUS

## 2023-07-14 MED ORDER — PROPOFOL 10 MG/ML IV BOLUS
INTRAVENOUS | Status: DC | PRN
  Administered 2023-07-14: 80 mg via INTRAVENOUS

## 2023-07-14 MED ORDER — LIDOCAINE HCL (CARDIAC) PF 100 MG/5ML IV SOSY
PREFILLED_SYRINGE | INTRAVENOUS | Status: DC | PRN
  Administered 2023-07-14: 50 mg via INTRAVENOUS

## 2023-07-14 NOTE — Anesthesia Preprocedure Evaluation (Signed)
Anesthesia Evaluation  Patient identified by MRN, date of birth, ID band Patient awake    Reviewed: Allergy & Precautions, NPO status , Patient's Chart, lab work & pertinent test results  Airway Mallampati: III  TM Distance: >3 FB Neck ROM: full    Dental  (+) Chipped, Dental Advidsory Given   Pulmonary neg pulmonary ROS, former smoker   Pulmonary exam normal        Cardiovascular negative cardio ROS Normal cardiovascular exam     Neuro/Psych negative neurological ROS  negative psych ROS   GI/Hepatic Neg liver ROS,GERD  ,,  Endo/Other  negative endocrine ROS    Renal/GU negative Renal ROS  negative genitourinary   Musculoskeletal   Abdominal   Peds  Hematology negative hematology ROS (+)   Anesthesia Other Findings Past Medical History: No date: Anemia No date: Complication of anesthesia     Comment:  HARD TIME WAKING UP-PT DOES NOT WANT GENERAL ANESTHESIA No date: GERD (gastroesophageal reflux disease)     Comment:  WILL OCCASSIONALLY TAKE MAALOX No date: History of hiatal hernia  Past Surgical History: 01/15/2018: BREAST CYST ASPIRATION; Right     Comment:  Neg No date: ECTOPIC PREGNANCY SURGERY No date: INCONTINENCE SURGERY     Comment:  mesh sling- 2013 No date: LAPAROSCOPIC HYSTERECTOMY 08/31/2015: OPEN REDUCTION INTERNAL FIXATION (ORIF) DISTAL RADIAL  FRACTURE; Left     Comment:  Procedure: OPEN REDUCTION INTERNAL FIXATION (ORIF)               DISTAL RADIAL FRACTURE;  Surgeon: Deeann Saint, MD;                Location: [facility] ORS;  Service: Orthopedics;  Laterality:               Left; No date: VAGINAL PROLAPSE REPAIR     Comment:  "bladder tack" after hysterectomy  BMI    Body Mass Index: 27.11 kg/m      Reproductive/Obstetrics negative OB ROS                             Anesthesia Physical Anesthesia Plan  ASA: 2  Anesthesia Plan: General   Post-op Pain  Management: Minimal or no pain anticipated   Induction: Intravenous  PONV Risk Score and Plan: 3 and Propofol infusion, TIVA and Ondansetron  Airway Management Planned: Nasal Cannula  Additional Equipment: None  Intra-op Plan:   Post-operative Plan:   Informed Consent: I have reviewed the patients History and Physical, chart, labs and discussed the procedure including the risks, benefits and alternatives for the proposed anesthesia with the patient or authorized representative who has indicated his/her understanding and acceptance.     Dental advisory given  Plan Discussed with: CRNA and Surgeon  Anesthesia Plan Comments: (Discussed risks of anesthesia with patient, including possibility of difficulty with spontaneous ventilation under anesthesia necessitating airway intervention, PONV, and rare risks such as cardiac or respiratory or neurological events, and allergic reactions. Discussed the role of CRNA in patient's perioperative care. Patient understands.)       Anesthesia Quick Evaluation

## 2023-07-14 NOTE — Transfer of Care (Signed)
Immediate Anesthesia Transfer of Care Note  Patient: Mariah Cooper  Procedure(s) Performed: COLONOSCOPY POLYPECTOMY SUBMUCOSAL TATTOO INJECTION  Patient Location: PACU and Endoscopy Unit  Anesthesia Type:General  Level of Consciousness: drowsy and patient cooperative  Airway & Oxygen Therapy: Patient Spontanous Breathing  Post-op Assessment: Report given to RN and Post -op Vital signs reviewed and stable  Post vital signs: Reviewed and stable  Last Vitals:  Vitals Value Taken Time  BP 132/66 07/14/23 1115  Temp    Pulse 70 07/14/23 1115  Resp 13 07/14/23 1115  SpO2 100 % 07/14/23 1115  Vitals shown include unfiled device data.  Last Pain:  Vitals:   07/14/23 1115  TempSrc:   PainSc: 0-No pain         Complications: No notable events documented.

## 2023-07-14 NOTE — Anesthesia Postprocedure Evaluation (Signed)
Anesthesia Post Note  Patient: TEIGAN CARVER  Procedure(s) Performed: COLONOSCOPY POLYPECTOMY SUBMUCOSAL TATTOO INJECTION  Patient location during evaluation: PACU Anesthesia Type: General Level of consciousness: awake and alert Pain management: pain level controlled Vital Signs Assessment: post-procedure vital signs reviewed and stable Respiratory status: spontaneous breathing, nonlabored ventilation, respiratory function stable and patient connected to nasal cannula oxygen Cardiovascular status: blood pressure returned to baseline and stable Postop Assessment: no apparent nausea or vomiting Anesthetic complications: no  No notable events documented.   Last Vitals:  Vitals:   07/14/23 0940 07/14/23 1115  BP: (!) 142/78 132/66  Pulse: 73 73  Resp: 20 14  Temp: (!) 36.2 C   SpO2: 98% 100%    Last Pain:  Vitals:   07/14/23 1115  TempSrc:   PainSc: 0-No pain                 Stephanie Coup

## 2023-07-14 NOTE — Interval H&P Note (Signed)
History and Physical Interval Note: Preprocedure H&P from 07/14/23  was reviewed and there was no interval change after seeing and examining the patient.  Written consent was obtained from the patient after discussion of risks, benefits, and alternatives. Patient has consented to proceed with Colonoscopy with possible intervention   07/14/2023 10:12 AM  Mariah Cooper  has presented today for surgery, with the diagnosis of History of adenomatous polyp of colon (Z86.010).  The various methods of treatment have been discussed with the patient and family. After consideration of risks, benefits and other options for treatment, the patient has consented to  Procedure(s): COLONOSCOPY (N/A) as a surgical intervention.  [name] history has been reviewed, patient examined, no change in status, stable for surgery.  I have reviewed the patient's chart and labs.  Questions were answered to the patient's satisfaction.     [name]

## 2023-07-14 NOTE — Op Note (Signed)
[facility]astroenterology Patient Name: Mariah Cooper Procedure Date: 07/14/2023 10:02 AM MRN: [id number] Account #: [id number] Date of Birth: 07-25-1956 Admit Type: Outpatient Age: 67 Room: [facility] ENDO ROOM 1 Gender: Female Note Status: Finalized Instrument Name: Peds Colonoscope [id number] Procedure:             Colonoscopy Indications:           High risk colon cancer surveillance: Personal history                         of colonic polyps Providers:             Trenda Moots, DO Referring MD:          Gracelyn Nurse, MD (Referring MD) Medicines:             Monitored Anesthesia Care Complications:         No immediate complications. Estimated blood loss:                         Minimal. Procedure:             Pre-Anesthesia Assessment:                        - Prior to the procedure, a History and Physical was                         performed, and patient medications and allergies were                         reviewed. The patient is competent. The risks and                         benefits of the procedure and the sedation options and                         risks were discussed with the patient. All questions                         were answered and informed consent was obtained.                         Patient identification and proposed procedure were                         verified by the physician, the nurse, the anesthetist                         and the technician in the endoscopy suite. Mental                         Status Examination: alert and oriented. Airway                         Examination: normal oropharyngeal airway and neck                         mobility. Respiratory Examination: clear to  auscultation. CV Examination: RRR, no murmurs, no S3                         or S4. Prophylactic Antibiotics: The patient does not                         require prophylactic antibiotics. Prior                          Anticoagulants: The patient has taken no anticoagulant                         or antiplatelet agents. ASA Grade Assessment: II - A                         patient with mild systemic disease. After reviewing                         the risks and benefits, the patient was deemed in                         satisfactory condition to undergo the procedure. The                         anesthesia plan was to use monitored anesthesia care                         (MAC). Immediately prior to administration of                         medications, the patient was re-assessed for adequacy                         to receive sedatives. The heart rate, respiratory                         rate, oxygen saturations, blood pressure, adequacy of                         pulmonary ventilation, and response to care were                         monitored throughout the procedure. The physical                         status of the patient was re-assessed after the                         procedure.                        After obtaining informed consent, the colonoscope was                         passed under direct vision. Throughout the procedure,                         the patient's blood pressure, pulse, and oxygen  saturations were monitored continuously. The                         Colonoscope was introduced through the anus with the                         intention of advancing to the cecum. The scope was                         advanced to the ascending colon before the procedure                         was aborted. Medications were given. The colonoscopy                         was unusually difficult due to multiple diverticula in                         the colon, restricted mobility of the colon and a                         redundant colon. Successful completion of the                         procedure was aided by changing the patient to a                          supine position, changing the patient to a prone                         position, using manual pressure, withdrawing the scope                         and replacing with the adult endoscope, straightening                         and shortening the scope to obtain bowel loop                         reduction, using scope torsion, applying abdominal                         pressure and lavage. The patient tolerated the                         procedure well. The quality of the bowel preparation                         was good. The rectum was photographed. Findings:      The digital rectal exam findings include decreased sphincter tone.      The perianal and digital rectal examinations were normal.      Multiple small-mouthed diverticula were found in the entire colon.       Severe disease in the left colon. Narrowed lumen in sigmoid. Pediatric       colonoscope unable to traverse due to narrowed lumen, restricted       mobility of the colon and multiple diverticula. Transitioned to  adult       gastroscope which was able to traverse Estimated blood loss: none.      A benign-appearing, intrinsic severe stenosis measuring 6 cm (in length)       x 1.2 cm (inner diameter) was found at 25 cm proximal to the anus and       was traversed. required change in scope as above to traverse. Area was       tattooed with an injection of 0.5 mL of [place] ink. Estimated blood loss:       none.      Able to advance to hepatic flexure. Scope Light was appreciated in the       right upper quadrant, Estimated blood loss: none.      A 1 to 2 mm polyp was found in the rectum. The polyp was sessile. The       polyp was removed with a jumbo cold forceps. Resection and retrieval       were complete. Estimated blood loss was minimal.      The lumen of the descending colon and transverse colon was moderately       dilated. Likely related to narrowing in the sigmoid colon. Impression:            - Decreased sphincter  tone found on digital rectal                         exam.                        - Diverticulosis in the entire examined colon.                        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 Tattooed.                        - One 1 to 2 mm polyp in the rectum, removed with a                         jumbo cold forceps. Resected and retrieved.                        - Dilated in the descending colon and in the                         transverse colon. Recommendation:        - Patient has a contact number available for                         emergencies. The signs and symptoms of potential                         delayed complications were discussed with the patient.                         Return to normal activities tomorrow. Written                         discharge instructions were provided to the patient.                        -  Discharge patient to home.                        - Resume previous diet.                        - Continue present medications.                        - Await pathology results.                        - Recommend CT colonography to evaluate remaining                         colon on right side that could not be reached.                        Consider surgical evaluation to resect area with                         luminal narrowing.                        - Return to GI office at appointment to be scheduled.                        - The findings and recommendations were discussed with                         the patient. Procedure Code(s):     --- Professional ---                        [phone], 52, Colonoscopy, flexible; with biopsy, single                         or multiple                        [id number], 52, Colonoscopy, flexible; with directed                         submucosal injection(s), any substance Diagnosis Code(s):     --- Professional ---                        Z86.010, Personal history of colonic polyps                        K62.89, Other  specified diseases of anus and rectum                        K56.699, Other intestinal obstruction unspecified as                         to partial versus complete obstruction                        D12.8, Benign neoplasm of rectum                        K59.39, Other megacolon  K57.30, Diverticulosis of large intestine without                         perforation or abscess without bleeding CPT copyright 2022 American Medical Association. All rights reserved. The codes documented in this report are preliminary and upon coder review may  be revised to meet current compliance requirements. Attending Participation:      I personally performed the entire procedure. Elfredia Nevins, DO [name] DO, DO 07/14/2023 11:22:40 AM This report has been signed electronically. Number of Addenda: 0 Note Initiated On: 07/14/2023 10:02 AM Total Procedure Duration: 0 hours 53 minutes 53 seconds  Estimated Blood Loss:  Estimated blood loss was minimal.      [facility]

## 2023-07-14 NOTE — H&P (Signed)
Pre-Procedure H&P   Patient ID: Mariah Cooper is a 67 y.o. female.  Gastroenterology Provider: [name], DO  Referring Provider: Dr. Letitia Libra PCP: Gracelyn Nurse, MD  Date: 07/14/2023  HPI Mariah Cooper is a 67 y.o. female who presents today for Colonoscopy for Surveillance-personal history of colon polyps .  Last csy 12/2017- 1 ta; 2007- tics and IH;  CT with severe wall thickening of left colon with tics.  S/p hysto and chole  Every other day bm; no melena/hematochezia  Hgb 13.8 mcv 93 plt 379   Past Medical History:  Diagnosis Date   Anemia    Complication of anesthesia    HARD TIME WAKING UP-PT DOES NOT WANT GENERAL ANESTHESIA   GERD (gastroesophageal reflux disease)    WILL OCCASSIONALLY TAKE MAALOX   History of hiatal hernia     Past Surgical History:  Procedure Laterality Date   BREAST CYST ASPIRATION Right 01/15/2018   Neg   ECTOPIC PREGNANCY SURGERY     INCONTINENCE SURGERY     mesh sling- 2013   LAPAROSCOPIC HYSTERECTOMY     OPEN REDUCTION INTERNAL FIXATION (ORIF) DISTAL RADIAL FRACTURE Left 08/31/2015   Procedure: OPEN REDUCTION INTERNAL FIXATION (ORIF) DISTAL RADIAL FRACTURE;  Surgeon: Deeann Saint, MD;  Location: [facility] ORS;  Service: Orthopedics;  Laterality: Left;   VAGINAL PROLAPSE REPAIR     "bladder tack" after hysterectomy    Family History No h/o GI disease or malignancy  Review of Systems  Constitutional:  Negative for activity change, appetite change, chills, diaphoresis, fatigue, fever and unexpected weight change.  HENT:  Negative for trouble swallowing and voice change.   Respiratory:  Negative for shortness of breath and wheezing.   Cardiovascular:  Negative for chest pain, palpitations and leg swelling.  Gastrointestinal:  Negative for abdominal distention, abdominal pain, anal bleeding, blood in stool, constipation, diarrhea, nausea, rectal pain and vomiting.  Musculoskeletal:  Negative for  arthralgias and myalgias.  Skin:  Negative for color change and pallor.  Neurological:  Negative for dizziness, syncope and weakness.  Psychiatric/Behavioral:  Negative for confusion.   All other systems reviewed and are negative.    Medications No current facility-administered medications on file prior to encounter.   Current Outpatient Medications on File Prior to Encounter  Medication Sig Dispense Refill   calcium carbonate (OSCAL) 1500 (600 Ca) MG TABS tablet Take 1,200 mg by mouth daily with breakfast.     cholecalciferol (VITAMIN D3) 25 MCG (1000 UT) tablet Take 1,000 Units by mouth daily.     Magnesium 100 MG TABS Take 100 mg by mouth daily.      Multiple Vitamin (MULTIVITAMIN WITH MINERALS) TABS tablet Take 1 tablet by mouth daily.      Pertinent medications related to GI and procedure were reviewed by me with the patient prior to the procedure   Current Facility-Administered Medications:    0.9 %  sodium chloride infusion, , Intravenous, Continuous, [name], DO, Last Rate: 20 mL/hr at 07/14/23 1001, Continued from Pre-op at 07/14/23 1001  sodium chloride 20 mL/hr at 07/14/23 1001       Allergies  Allergen Reactions   Sulfa Antibiotics Itching   Ivp Dye [Iodinated Contrast Media] Rash   Tape Rash    OK TO USE PAPER TAPE   Allergies were reviewed by me prior to the procedure  Objective   Body mass index is 27.11 kg/m. Vitals:   07/14/23 0940  BP: (!) 142/78  Pulse: 73  Resp: 20  Temp: (!) 97.2 F (36.2 C)  TempSrc: Temporal  SpO2: 98%  Weight: 67.2 kg  Height: 5\' 2"  (1.575 m)     Physical Exam Vitals and nursing note reviewed.  Constitutional:      General: She is not in acute distress.    Appearance: Normal appearance. She is not ill-appearing, toxic-appearing or diaphoretic.  HENT:     Head: Normocephalic and atraumatic.     Nose: Nose normal.     Mouth/Throat:     Mouth: Mucous membranes are moist.     Pharynx: Oropharynx is  clear.  Eyes:     General: No scleral icterus.    Extraocular Movements: Extraocular movements intact.  Cardiovascular:     Rate and Rhythm: Normal rate and regular rhythm.     Heart sounds: Normal heart sounds. No murmur heard.    No friction rub. No gallop.  Pulmonary:     Effort: Pulmonary effort is normal. No respiratory distress.     Breath sounds: Normal breath sounds. No wheezing, rhonchi or rales.  Abdominal:     General: Bowel sounds are normal. There is no distension.     Palpations: Abdomen is soft.     Tenderness: There is no abdominal tenderness. There is no guarding or rebound.  Musculoskeletal:     Cervical back: Neck supple.     Right lower leg: No edema.     Left lower leg: No edema.  Skin:    General: Skin is warm and dry.     Coloration: Skin is not jaundiced or pale.  Neurological:     General: No focal deficit present.     Mental Status: She is alert and oriented to person, place, and time. Mental status is at baseline.  Psychiatric:        Mood and Affect: Mood normal.        Behavior: Behavior normal.        Thought Content: Thought content normal.        Judgment: Judgment normal.      Assessment:  Ms. Mariah Cooper is a 67 y.o. female  who presents today for Colonoscopy for Surveillance-personal history of colon polyps .  Plan:  Colonoscopy with possible intervention today  Colonoscopy with possible biopsy, control of bleeding, polypectomy, and interventions as necessary has been discussed with the patient/patient representative. Informed consent was obtained from the patient/patient representative after explaining the indication, nature, and risks of the procedure including but not limited to death, bleeding, perforation, missed neoplasm/lesions, cardiorespiratory compromise, and reaction to medications. Opportunity for questions was given and appropriate answers were provided. Patient/patient representative has verbalized understanding is  amenable to undergoing the procedure.   [name], DO  [facility]  Portions of the record may have been created with voice recognition software. Occasional wrong-word or 'sound-a-like' substitutions may have occurred due to the inherent limitations of voice recognition software.  Read the chart carefully and recognize, using context, where substitutions may have occurred.

## 2023-07-17 ENCOUNTER — Other Ambulatory Visit: Payer: Self-pay | Admitting: Gastroenterology

## 2023-07-17 DIAGNOSIS — K5730 Diverticulosis of large intestine without perforation or abscess without bleeding: Secondary | ICD-10-CM

## 2023-07-17 LAB — SURGICAL PATHOLOGY

## 2023-07-20 ENCOUNTER — Other Ambulatory Visit: Payer: Self-pay | Admitting: Gastroenterology

## 2023-07-20 ENCOUNTER — Ambulatory Visit: Payer: Medicare Other | Admitting: Obstetrics and Gynecology

## 2023-07-20 DIAGNOSIS — D126 Benign neoplasm of colon, unspecified: Secondary | ICD-10-CM

## 2023-07-20 DIAGNOSIS — K5730 Diverticulosis of large intestine without perforation or abscess without bleeding: Secondary | ICD-10-CM

## 2023-07-20 DIAGNOSIS — K56699 Other intestinal obstruction unspecified as to partial versus complete obstruction: Secondary | ICD-10-CM

## 2023-07-20 DIAGNOSIS — Q438 Other specified congenital malformations of intestine: Secondary | ICD-10-CM

## 2023-07-31 ENCOUNTER — Ambulatory Visit
Admission: RE | Admit: 2023-07-31 | Discharge: 2023-07-31 | Disposition: A | Discharge status: Home / Self Care | Payer: Medicare Other | Source: Ambulatory Visit | Attending: Gastroenterology | Admitting: Gastroenterology

## 2023-07-31 ENCOUNTER — Other Ambulatory Visit: Payer: Medicare Other

## 2023-07-31 DIAGNOSIS — Q438 Other specified congenital malformations of intestine: Secondary | ICD-10-CM

## 2023-07-31 DIAGNOSIS — K5730 Diverticulosis of large intestine without perforation or abscess without bleeding: Secondary | ICD-10-CM

## 2023-07-31 DIAGNOSIS — D126 Benign neoplasm of colon, unspecified: Secondary | ICD-10-CM

## 2023-07-31 DIAGNOSIS — K56699 Other intestinal obstruction unspecified as to partial versus complete obstruction: Secondary | ICD-10-CM

## 2023-08-01 DIAGNOSIS — K56699 Other intestinal obstruction unspecified as to partial versus complete obstruction: Secondary | ICD-10-CM

## 2023-08-01 HISTORY — DX: Other intestinal obstruction unspecified as to partial versus complete obstruction: K56.699

## 2023-08-03 ENCOUNTER — Inpatient Hospital Stay: Admission: RE | Admit: 2023-08-03 | Payer: Medicare Other | Source: Ambulatory Visit

## 2023-08-15 ENCOUNTER — Ambulatory Visit: Payer: Self-pay | Admitting: General Surgery

## 2023-08-15 ENCOUNTER — Other Ambulatory Visit: Payer: Self-pay | Admitting: General Surgery

## 2023-08-15 DIAGNOSIS — K56699 Other intestinal obstruction unspecified as to partial versus complete obstruction: Secondary | ICD-10-CM

## 2023-08-15 DIAGNOSIS — R1032 Left lower quadrant pain: Secondary | ICD-10-CM

## 2023-08-15 NOTE — H&P (View-Only) (Signed)
PATIENT PROFILE: Mariah Cooper is a 67 y.o. female who presents to the Clinic for consultation at the request of Dr. Timothy Cooper for evaluation of stricture of the sigmoid colon.   PCP:  Mariah Montgomery, MD   HISTORY OF PRESENT ILLNESS: Mariah Cooper reports she was getting evaluated for a renal lesion and she was found with stricture of the sigmoid colon.  She was referred to gastroenterology for colonoscopy.  Colonoscopy was unable to be completed not being able to pass the sigmoid colon due to stricture.  Multiple techniques were tried unable to pass the colonoscope.  I personally evaluated the images of the colonoscopy.  This was followed by barium enema which confirmed stricture of the sigmoid colon.  No ulcerations were identified.  I also evaluated the images of the MRI performed in April 2024.   Previous complete colonoscopy was in 01/23/2018.  Multiple polyps were identified and removed.  Diverticulosis was identified.   Patient denies any abdominal pain.  Patient tolerating diet.  Patient having bowel movement.  Patient endorses that her only known episode of diverticulitis was about 20 years ago.  As per patient it was a complicated diverticulitis with abscess.  No surgical recommendation was given at that time.     PROBLEM LIST: Problem List  Date Reviewed: 11/30/2021            Noted    Thoracic aortic atherosclerosis (CMS-HCC) 03/10/2021    Simple chronic bronchitis (CMS/HHS-HCC) 03/10/2021    Osteoporosis 11/27/2015    Closed fracture of both wrists 11/18/2015    Carpal tunnel syndrome 10/12/2015      GENERAL REVIEW OF SYSTEMS:    General ROS: negative for - chills, fatigue, fever, weight gain or weight loss Allergy and Immunology ROS: negative for - hives  Hematological and Lymphatic ROS: negative for - bleeding problems or bruising, negative for palpable nodes Endocrine ROS: negative for - heat or cold intolerance, hair changes Respiratory ROS: negative for -  cough, shortness of breath or wheezing Cardiovascular ROS: no chest pain or palpitations GI ROS: negative for nausea, vomiting, abdominal pain, diarrhea, constipation Musculoskeletal ROS: negative for - joint swelling or muscle pain Neurological ROS: negative for - confusion, syncope Dermatological ROS: negative for pruritus and rash Psychiatric: negative for anxiety, depression, difficulty sleeping and memory loss   MEDICATIONS: Current Medications        Current Outpatient Medications  Medication Sig Dispense Refill   acetaminophen (TYLENOL) 500 MG tablet Take 1,000 mg by mouth as needed for Pain       calcium carbonate-vitamin D3 (CALTRATE 600+D) 600 mg-10 mcg (400 unit) tablet Take 1 tablet by mouth 2 (two) times daily with meals       cholecalciferol, vitamin D3, (VITAMIN D3) 125 mcg (5,000 unit) tablet Take 5,000 Units by mouth once daily       diphenhydrAMINE (BENADRYL) 50 MG tablet Take 1 tablet (50 mg total) by mouth once for 1 dose 1 hour prior to IV contrast. 1 tablet 0   folic acid/multivit-min/lutein (CENTRUM SILVER ORAL) Take by mouth       ibuprofen (MOTRIN) 200 MG tablet Take 400 mg by mouth as needed for Pain       magnesium 250 mg Tab Take 2 tablets by mouth once daily       metroNIDAZOLE (FLAGYL) 500 MG tablet Take 2 tablets at 2 pm, 3 pm and 10 pm the day before surgery. 6 tablet 0   neomycin 500 mg tablet Take 2  tablets at 2 pm, 3 pm and 10 pm the day before surgery. 6 tablet 0   predniSONE (DELTASONE) 50 MG tablet Take 1 tablet (50 mg total) by mouth as directed 50 mg 1 pill 13 hours before test, 50 mg 1 pill 7 hours before, 50 mg 1 pill 1 hour before (Patient not taking: Reported on 08/15/2023) 3 tablet 0   predniSONE (DELTASONE) 50 MG tablet Take one (1) tablet at 13 hours, 7 hours, and 1 hour prior to IV contrast. 3 tablet 0   sodium, potassium, and magnesium (SUPREP) oral solution Take 1 Bottle by mouth as directed One kit contains 2 bottles.  Take both bottles at  the times instructed by your provider. (Patient not taking: Reported on 08/15/2023) 354 mL 0    No current facility-administered medications for this visit.        ALLERGIES: Dye, Iodinated contrast media, Sulfa (sulfonamide antibiotics), and Adhesive tape-silicones   PAST MEDICAL HISTORY: Past Medical History      Past Medical History:  Diagnosis Date   Allergic state sulfa    contrast dye   Diverticulosis     GERD (gastroesophageal reflux disease)     Osteoporosis     Sleep apnea          PAST SURGICAL HISTORY: Past Surgical History       Past Surgical History:  Procedure Laterality Date   HYSTERECTOMY   71    TVH with bladder tack, still has ovaries   COLONOSCOPY   06/27/2006    Int Hemorrhoids, Diverticulosis: CBF 05/2016; Recall Ltr mailed 05/04/2016 (dw)   EGD   06/27/2006    Normal: No repeat per RTE   TVT   2013   COLONOSCOPY   01/23/2018    Adenomatous Polyp: CBF 12/2022  (01/19/2023 Recall letter returned.awb)   Colon @ [facility]   07/14/2023    Hyperplastic polyp/PHx CP/TBD awaiting CT colonography/SMR   CHOLECYSTECTOMY       FRACTURE SURGERY   Wrist   LAPAROSCOPIC REMOVAL ECTOPIC PREGNANCY       TUBAL LIGATION            FAMILY HISTORY: Family History        Family History  Problem Relation Name Age of Onset   High blood pressure (Hypertension) Mother Mariah Cooper     High blood pressure (Hypertension) Father Mariah Cooper          SOCIAL HISTORY: Social History  Social History         Socioeconomic History   Marital status: Single  Tobacco Use   Smoking status: Former      Current packs/day: 0.00      Average packs/day: 1 pack/day for 43.1 years (43.1 ttl pk-yrs)      Types: Cigarettes      Start date: 07/01/1972      Quit date: 08/22/2015      Years since quitting: 7.9   Smokeless tobacco: Never  Substance and Sexual Activity   Alcohol use: Not Currently   Drug use: No   Sexual activity: Not Currently      Partners: Male      Birth  control/protection: Surgical        PHYSICAL EXAM:    Vitals:    08/15/23 1112  BP: (!) 172/75  Pulse: 71    Body mass index is 27.25 kg/m. Weight: 67.6 kg (149 lb)    GENERAL: Alert, active, oriented x3   HEENT: Pupils equal reactive to light.  Extraocular movements are intact. Sclera clear. Palpebral conjunctiva normal red color.Pharynx clear.   NECK: Supple with no palpable mass and no adenopathy.   LUNGS: Sound clear with no rales rhonchi or wheezes.   HEART: Regular rhythm S1 and S2 without murmur.   ABDOMEN: Soft and depressible, nontender with no palpable mass, no hepatomegaly.    EXTREMITIES: Well-developed well-nourished symmetrical with no dependent edema.   NEUROLOGICAL: Awake alert oriented, facial expression symmetrical, moving all extremities.   REVIEW OF DATA: I have reviewed the following data today:      No visits with results within 3 Month(s) from this visit.  Latest known visit with results is:  Appointment on 11/30/2022  Component Date Value   Glucose 11/30/2022 109    Sodium 11/30/2022 137    Potassium 11/30/2022 4.4    Chloride 11/30/2022 99    Carbon Dioxide (CO2) 11/30/2022 30.9    Urea Nitrogen (BUN) 11/30/2022 19    Creatinine 11/30/2022 0.8    Glomerular Filtration Ra* 11/30/2022 80    Calcium 11/30/2022 9.8    AST  11/30/2022 12    ALT  11/30/2022 11    Alk Phos (alkaline Phosp* 11/30/2022 74    Albumin 11/30/2022 4.4    Bilirubin, Total 11/30/2022 0.5    Protein, Total 11/30/2022 7.5    A/G Ratio 11/30/2022 1.4    WBC (White Blood Cell Co* 11/30/2022 9.1    RBC (Red Blood Cell Coun* 11/30/2022 4.51    Hemoglobin 11/30/2022 13.8    Hematocrit 11/30/2022 41.8    MCV (Mean Corpuscular Vo* 11/30/2022 92.7    MCH (Mean Corpuscular He* 11/30/2022 30.6    MCHC (Mean Corpuscular H* 11/30/2022 33.0    Platelet Count 11/30/2022 379    RDW-CV (Red Cell Distrib* 11/30/2022 12.2    MPV (Mean Platelet Volum* 11/30/2022 8.8 (L)     Neutrophils 11/30/2022 5.52    Lymphocytes 11/30/2022 2.49    Monocytes 11/30/2022 0.87    Eosinophils 11/30/2022 0.17    Basophils 11/30/2022 0.05    Neutrophil % 11/30/2022 60.6    Lymphocyte % 11/30/2022 27.3    Monocyte % 11/30/2022 9.5    Eosinophil % 11/30/2022 1.9    Basophil% 11/30/2022 0.5    Immature Granulocyte % 11/30/2022 0.2    Immature Granulocyte Cou* 11/30/2022 0.02    Cholesterol, Total 11/30/2022 168    Triglyceride 11/30/2022 242 (H)    HDL (High Density Lipopr* 29/56/2130 39.4    LDL Calculated 11/30/2022 80    VLDL Cholesterol 11/30/2022 48    Cholesterol/HDL Ratio 11/30/2022 4.3    Color 11/30/2022 Colorless    Clarity 11/30/2022 Clear    Specific Gravity 11/30/2022 1.008    pH, Urine 11/30/2022 7.0    Protein, Urinalysis 11/30/2022 Negative    Glucose, Urinalysis 11/30/2022 Negative    Ketones, Urinalysis 11/30/2022 Negative    Blood, Urinalysis 11/30/2022 Negative    Nitrite, Urinalysis 11/30/2022 Negative    Leukocyte Esterase, Urin* 11/30/2022 Negative    Bilirubin, Urinalysis 11/30/2022 Negative    Urobilinogen, Urinalysis 11/30/2022 0.2    WBC, UA 11/30/2022 <1    Red Blood Cells, Urinaly* 11/30/2022 <1    Bacteria, Urinalysis 11/30/2022 0-5    Squamous Epithelial Cell* 11/30/2022 0       ASSESSMENT: Ms. Granville is a 67 y.o. female presenting for consultation for stricture of the sigmoid colon.     Patient with stricture of the sigmoid colon.  Even though patient is currently asymptomatic patient was unable to  get a complete colonoscopy.  I think that patient will benefit of partial colectomy with anastomosis.  This is most likely related to chronic diverticulitis with chronic stricture.  Since her last image was 6 months ago I think the patient would benefit of recent CT scan for surgical planning.  They will help decide if patient will benefit of colectomy with anastomosis versus the possible need of colostomy.   Patient was oriented about  the plan of robotic assisted laparoscopic partial colectomy.  She was oriented about the benefit of the surgery.  She was oriented about the rate of the surgery including bleeding, infection, leak of anastomosis, intestinal blockage, injury to adjacent organs such as kidney, ureter, bladder, bowel, among others.  The patient reports she understood and agreed with plan.   Stricture of sigmoid colon (CMS/HHS-HCC) [K56.699]   PLAN: 1.  CT scan of the abdomen and pelvis for evaluation of sigmoid stricture and adjacent organs. 2.  Will coordinate robotic assisted laparoscopic partial colectomy of the sigmoid colon (16109, 44213) 3.  Will need to take bowel prep the day before the surgery as instructed 4.  Will need to take antibiotic therapy the day before surgery as prescribed 5.  Avoid taking aspirin 5 days before the surgery 6.  Contact us if you have any concern   Patient verbalized understanding, all questions were answered, and were agreeable with the plan outlined above.    Carolan Shiver, MD   Electronically signed by Carolan Shiver, MD

## 2023-08-15 NOTE — H&P (Signed)
PATIENT PROFILE: Mariah Cooper is a 67 y.o. female who presents to the Clinic for consultation at the request of Mariah Cooper for evaluation of stricture of the sigmoid colon.   PCP:  Mariah Montgomery, MD   HISTORY OF PRESENT ILLNESS: Mariah Cooper reports she was getting evaluated for a renal lesion and she was found with stricture of the sigmoid colon.  She was referred to gastroenterology for colonoscopy.  Colonoscopy was unable to be completed not being able to pass the sigmoid colon due to stricture.  Multiple techniques were tried unable to pass the colonoscope.  I personally evaluated the images of the colonoscopy.  This was followed by barium enema which confirmed stricture of the sigmoid colon.  No ulcerations were identified.  I also evaluated the images of the MRI performed in April 2024.   Previous complete colonoscopy was in 01/23/2018.  Multiple polyps were identified and removed.  Diverticulosis was identified.   Patient denies any abdominal pain.  Patient tolerating diet.  Patient having bowel movement.  Patient endorses that her only known episode of diverticulitis was about 20 years ago.  As per patient it was a complicated diverticulitis with abscess.  No surgical recommendation was given at that time.     PROBLEM LIST: Problem List  Date Reviewed: 11/30/2021            Noted    Thoracic aortic atherosclerosis (CMS-HCC) 03/10/2021    Simple chronic bronchitis (CMS/HHS-HCC) 03/10/2021    Osteoporosis 11/27/2015    Closed fracture of both wrists 11/18/2015    Carpal tunnel syndrome 10/12/2015      GENERAL REVIEW OF SYSTEMS:    General ROS: negative for - chills, fatigue, fever, weight gain or weight loss Allergy and Immunology ROS: negative for - hives  Hematological and Lymphatic ROS: negative for - bleeding problems or bruising, negative for palpable nodes Endocrine ROS: negative for - heat or cold intolerance, hair changes Respiratory ROS: negative for -  cough, shortness of breath or wheezing Cardiovascular ROS: no chest pain or palpitations GI ROS: negative for nausea, vomiting, abdominal pain, diarrhea, constipation Musculoskeletal ROS: negative for - joint swelling or muscle pain Neurological ROS: negative for - confusion, syncope Dermatological ROS: negative for pruritus and rash Psychiatric: negative for anxiety, depression, difficulty sleeping and memory loss   MEDICATIONS: Current Medications        Current Outpatient Medications  Medication Sig Dispense Refill   acetaminophen (TYLENOL) 500 MG tablet Take 1,000 mg by mouth as needed for Pain       calcium carbonate-vitamin D3 (CALTRATE 600+D) 600 mg-10 mcg (400 unit) tablet Take 1 tablet by mouth 2 (two) times daily with meals       cholecalciferol, vitamin D3, (VITAMIN D3) 125 mcg (5,000 unit) tablet Take 5,000 Units by mouth once daily       diphenhydrAMINE (BENADRYL) 50 MG tablet Take 1 tablet (50 mg total) by mouth once for 1 dose 1 hour prior to IV contrast. 1 tablet 0   folic acid/multivit-min/lutein (CENTRUM SILVER ORAL) Take by mouth       ibuprofen (MOTRIN) 200 MG tablet Take 400 mg by mouth as needed for Pain       magnesium 250 mg Tab Take 2 tablets by mouth once daily       metroNIDAZOLE (FLAGYL) 500 MG tablet Take 2 tablets at 2 pm, 3 pm and 10 pm the day before surgery. 6 tablet 0   neomycin 500 mg tablet Take 2  tablets at 2 pm, 3 pm and 10 pm the day before surgery. 6 tablet 0   predniSONE (DELTASONE) 50 MG tablet Take 1 tablet (50 mg total) by mouth as directed 50 mg 1 pill 13 hours before test, 50 mg 1 pill 7 hours before, 50 mg 1 pill 1 hour before (Patient not taking: Reported on 08/15/2023) 3 tablet 0   predniSONE (DELTASONE) 50 MG tablet Take one (1) tablet at 13 hours, 7 hours, and 1 hour prior to IV contrast. 3 tablet 0   sodium, potassium, and magnesium (SUPREP) oral solution Take 1 Bottle by mouth as directed One kit contains 2 bottles.  Take both bottles at  the times instructed by your provider. (Patient not taking: Reported on 08/15/2023) 354 mL 0    No current facility-administered medications for this visit.        ALLERGIES: Dye, Iodinated contrast media, Sulfa (sulfonamide antibiotics), and Adhesive tape-silicones   PAST MEDICAL HISTORY: Past Medical History      Past Medical History:  Diagnosis Date   Allergic state sulfa    contrast dye   Diverticulosis     GERD (gastroesophageal reflux disease)     Osteoporosis     Sleep apnea          PAST SURGICAL HISTORY: Past Surgical History       Past Surgical History:  Procedure Laterality Date   HYSTERECTOMY   71    TVH with bladder tack, still has ovaries   COLONOSCOPY   06/27/2006    Int Hemorrhoids, Diverticulosis: CBF 05/2016; Recall Ltr mailed 05/04/2016 (dw)   EGD   06/27/2006    Normal: No repeat per RTE   TVT   2013   COLONOSCOPY   01/23/2018    Adenomatous Polyp: CBF 12/2022  (01/19/2023 Recall letter returned.awb)   Colon @ [facility]   07/14/2023    Hyperplastic polyp/PHx CP/TBD awaiting CT colonography/SMR   CHOLECYSTECTOMY       FRACTURE SURGERY   Wrist   LAPAROSCOPIC REMOVAL ECTOPIC PREGNANCY       TUBAL LIGATION            FAMILY HISTORY: Family History        Family History  Problem Relation Name Age of Onset   High blood pressure (Hypertension) Mother Mariah Cooper     High blood pressure (Hypertension) Father Mariah Cooper          SOCIAL HISTORY: Social History  Social History         Socioeconomic History   Marital status: Single  Tobacco Use   Smoking status: Former      Current packs/day: 0.00      Average packs/day: 1 pack/day for 43.1 years (43.1 ttl pk-yrs)      Types: Cigarettes      Start date: 07/01/1972      Quit date: 08/22/2015      Years since quitting: 7.9   Smokeless tobacco: Never  Substance and Sexual Activity   Alcohol use: Not Currently   Drug use: No   Sexual activity: Not Currently      Partners: Male      Birth  control/protection: Surgical        PHYSICAL EXAM:    Vitals:    08/15/23 1112  BP: (!) 172/75  Pulse: 71    Body mass index is 27.25 kg/m. Weight: 67.6 kg (149 lb)    GENERAL: Alert, active, oriented x3   HEENT: Pupils equal reactive to light.  Extraocular movements are intact. Sclera clear. Palpebral conjunctiva normal red color.Pharynx clear.   NECK: Supple with no palpable mass and no adenopathy.   LUNGS: Sound clear with no rales rhonchi or wheezes.   HEART: Regular rhythm S1 and S2 without murmur.   ABDOMEN: Soft and depressible, nontender with no palpable mass, no hepatomegaly.    EXTREMITIES: Well-developed well-nourished symmetrical with no dependent edema.   NEUROLOGICAL: Awake alert oriented, facial expression symmetrical, moving all extremities.   REVIEW OF DATA: I have reviewed the following data today:      No visits with results within 3 Month(s) from this visit.  Latest known visit with results is:  Appointment on 11/30/2022  Component Date Value   Glucose 11/30/2022 109    Sodium 11/30/2022 137    Potassium 11/30/2022 4.4    Chloride 11/30/2022 99    Carbon Dioxide (CO2) 11/30/2022 30.9    Urea Nitrogen (BUN) 11/30/2022 19    Creatinine 11/30/2022 0.8    Glomerular Filtration Ra* 11/30/2022 80    Calcium 11/30/2022 9.8    AST  11/30/2022 12    ALT  11/30/2022 11    Alk Phos (alkaline Phosp* 11/30/2022 74    Albumin 11/30/2022 4.4    Bilirubin, Total 11/30/2022 0.5    Protein, Total 11/30/2022 7.5    A/G Ratio 11/30/2022 1.4    WBC (White Blood Cell Co* 11/30/2022 9.1    RBC (Red Blood Cell Coun* 11/30/2022 4.51    Hemoglobin 11/30/2022 13.8    Hematocrit 11/30/2022 41.8    MCV (Mean Corpuscular Vo* 11/30/2022 92.7    MCH (Mean Corpuscular He* 11/30/2022 30.6    MCHC (Mean Corpuscular H* 11/30/2022 33.0    Platelet Count 11/30/2022 379    RDW-CV (Red Cell Distrib* 11/30/2022 12.2    MPV (Mean Platelet Volum* 11/30/2022 8.8 (L)     Neutrophils 11/30/2022 5.52    Lymphocytes 11/30/2022 2.49    Monocytes 11/30/2022 0.87    Eosinophils 11/30/2022 0.17    Basophils 11/30/2022 0.05    Neutrophil % 11/30/2022 60.6    Lymphocyte % 11/30/2022 27.3    Monocyte % 11/30/2022 9.5    Eosinophil % 11/30/2022 1.9    Basophil% 11/30/2022 0.5    Immature Granulocyte % 11/30/2022 0.2    Immature Granulocyte Cou* 11/30/2022 0.02    Cholesterol, Total 11/30/2022 168    Triglyceride 11/30/2022 242 (H)    HDL (High Density Lipopr* 29/56/2130 39.4    LDL Calculated 11/30/2022 80    VLDL Cholesterol 11/30/2022 48    Cholesterol/HDL Ratio 11/30/2022 4.3    Color 11/30/2022 Colorless    Clarity 11/30/2022 Clear    Specific Gravity 11/30/2022 1.008    pH, Urine 11/30/2022 7.0    Protein, Urinalysis 11/30/2022 Negative    Glucose, Urinalysis 11/30/2022 Negative    Ketones, Urinalysis 11/30/2022 Negative    Blood, Urinalysis 11/30/2022 Negative    Nitrite, Urinalysis 11/30/2022 Negative    Leukocyte Esterase, Urin* 11/30/2022 Negative    Bilirubin, Urinalysis 11/30/2022 Negative    Urobilinogen, Urinalysis 11/30/2022 0.2    WBC, UA 11/30/2022 <1    Red Blood Cells, Urinaly* 11/30/2022 <1    Bacteria, Urinalysis 11/30/2022 0-5    Squamous Epithelial Cell* 11/30/2022 0       ASSESSMENT: Ms. Granville is a 67 y.o. female presenting for consultation for stricture of the sigmoid colon.     Patient with stricture of the sigmoid colon.  Even though patient is currently asymptomatic patient was unable to  get a complete colonoscopy.  I think that patient will benefit of partial colectomy with anastomosis.  This is most likely related to chronic diverticulitis with chronic stricture.  Since her last image was 6 months ago I think the patient would benefit of recent CT scan for surgical planning.  They will help decide if patient will benefit of colectomy with anastomosis versus the possible need of colostomy.   Patient was oriented about  the plan of robotic assisted laparoscopic partial colectomy.  She was oriented about the benefit of the surgery.  She was oriented about the rate of the surgery including bleeding, infection, leak of anastomosis, intestinal blockage, injury to adjacent organs such as kidney, ureter, bladder, bowel, among others.  The patient reports she understood and agreed with plan.   Stricture of sigmoid colon (CMS/HHS-HCC) [K56.699]   PLAN: 1.  CT scan of the abdomen and pelvis for evaluation of sigmoid stricture and adjacent organs. 2.  Will coordinate robotic assisted laparoscopic partial colectomy of the sigmoid colon (16109, [id number]) 3.  Will need to take bowel prep the day before the surgery as instructed 4.  Will need to take antibiotic therapy the day before surgery as prescribed 5.  Avoid taking aspirin 5 days before the surgery 6.  Contact us if you have any concern   Patient verbalized understanding, all questions were answered, and were agreeable with the plan outlined above.    Carolan Shiver, MD   Electronically signed by Carolan Shiver, MD

## 2023-08-18 ENCOUNTER — Ambulatory Visit
Admission: RE | Admit: 2023-08-18 | Discharge: 2023-08-18 | Disposition: A | Discharge status: Home / Self Care | Payer: Medicare Other | Source: Ambulatory Visit | Attending: General Surgery | Admitting: General Surgery

## 2023-08-18 DIAGNOSIS — R1032 Left lower quadrant pain: Secondary | ICD-10-CM | POA: Insufficient documentation

## 2023-08-18 DIAGNOSIS — K56699 Other intestinal obstruction unspecified as to partial versus complete obstruction: Secondary | ICD-10-CM | POA: Insufficient documentation

## 2023-08-18 MED ORDER — IOHEXOL 300 MG/ML  SOLN
100.0000 mL | Freq: Once | INTRAMUSCULAR | Status: AC | PRN
  Administered 2023-08-18: 100 mL via INTRAVENOUS

## 2023-08-18 MED ORDER — BARIUM SULFATE 2 % PO SUSP
450.0000 mL | ORAL | Status: AC
  Administered 2023-08-18 (×2): 450 mL via ORAL

## 2023-08-28 ENCOUNTER — Other Ambulatory Visit: Payer: Self-pay

## 2023-08-30 ENCOUNTER — Encounter
Admission: RE | Admit: 2023-08-30 | Discharge: 2023-08-30 | Disposition: A | Discharge status: Home / Self Care | Payer: Medicare Other | Source: Ambulatory Visit | Attending: General Surgery | Admitting: General Surgery

## 2023-08-30 ENCOUNTER — Other Ambulatory Visit: Payer: Self-pay

## 2023-08-30 DIAGNOSIS — D649 Anemia, unspecified: Secondary | ICD-10-CM

## 2023-08-30 DIAGNOSIS — Z01812 Encounter for preprocedural laboratory examination: Secondary | ICD-10-CM

## 2023-08-30 HISTORY — DX: Unspecified osteoarthritis, unspecified site: M19.90

## 2023-08-30 NOTE — Patient Instructions (Addendum)
Your procedure is scheduled on:09/06/23 - Wednesday Report to the Registration Desk on the 1st floor of the [facility]. To find out your arrival time, please call [phone] between 1PM - 3PM on: 09/05/23 - Tuesday If your arrival time is 6:00 am, do not arrive before that time as the [facility] entrance doors do not open until 6:00 am.  REMEMBER: Instructions that are not followed completely may result in serious medical risk, up to and including death; or upon the discretion of your surgeon and anesthesiologist your surgery may need to be rescheduled.  Do not eat food after midnight the night before surgery.  No gum chewing or hard candies.  Follow clear liquid diet instructions on the day before surgery  One week prior to surgery: Stop Anti-inflammatories (NSAIDS) such as Advil, Aleve, Ibuprofen, Motrin, Naproxen, Naprosyn and Aspirin based products such as Excedrin, Goody's Powder, BC Powder. You may take Tylenol if needed for pain up until the day of surgery.  Stop ANY OVER THE COUNTER supplements until after surgery : Multivitamin, calcium, Vitamin D, Magnesium.   You Will need to take bowel prep the day before the surgery as instructed   You  Will need to take antibiotic therapy the day before surgery as prescribed    Avoid taking aspirin 5 days before the surgery  ON THE DAY OF SURGERY ONLY TAKE THESE MEDICATIONS WITH SIPS OF WATER:  none   No Alcohol for 24 hours before or after surgery.  No Smoking including e-cigarettes for 24 hours before surgery.  No chewable tobacco products for at least 6 hours before surgery.  No nicotine patches on the day of surgery.  Do not use any "recreational" drugs for at least a week (preferably 2 weeks) before your surgery.  Please be advised that the combination of cocaine and anesthesia may have negative outcomes, up to and including death. If you test positive for cocaine, your surgery will be cancelled.  On the morning of  surgery brush your teeth with toothpaste and water, you may rinse your mouth with mouthwash if you wish. Do not swallow any toothpaste or mouthwash.  Use CHG Soap or wipes as directed on instruction sheet.  Do not wear jewelry, make-up, hairpins, clips or nail polish.  For welded (permanent) jewelry: bracelets, anklets, waist bands, etc.  Please have this removed prior to surgery.  If it is not removed, there is a chance that hospital personnel will need to cut it off on the day of surgery.  Do not wear lotions, powders, or perfumes.   Do not shave body hair from the neck down 48 hours before surgery.  Contact lenses, hearing aids and dentures may not be worn into surgery.  Do not bring valuables to the hospital. [facility] is not responsible for any missing/lost belongings or valuables.   Notify your doctor if there is any change in your medical condition (cold, fever, infection).  Wear comfortable clothing (specific to your surgery type) to the hospital.  After surgery, you can help prevent lung complications by doing breathing exercises.  Take deep breaths and cough every 1-2 hours. Your doctor may order a device called an Incentive Spirometer to help you take deep breaths. When coughing or sneezing, hold a pillow firmly against your incision with both hands. This is called "splinting." Doing this helps protect your incision. It also decreases belly discomfort.  If you are being admitted to the hospital overnight, leave your suitcase in the car. After surgery it  may be brought to your room.  In case of increased patient census, it may be necessary for you, the patient, to continue your postoperative care in the Same Day Surgery department.  If you are being discharged the day of surgery, you will not be allowed to drive home. You will need a responsible individual to drive you home and stay with you for 24 hours after surgery.   If you are taking public transportation, you will  need to have a responsible individual with you.  Please call the Pre-admissions Testing Dept. at [phone] if you have any questions about these instructions.  Surgery Visitation Policy:  Patients having surgery or a procedure may have two visitors.  Children under the age of 69 must have an adult with them who is not the patient.  Inpatient Visitation:    Visiting hours are 7 a.m. to 8 p.m. Up to four visitors are allowed at one time in a patient room. The visitors may rotate out with other people during the day.  One visitor age 58 or older may stay with the patient overnight and must be in the room by 8 p.m.    Preparing for Surgery with CHLORHEXIDINE GLUCONATE (CHG) Soap  Chlorhexidine Gluconate (CHG) Soap  o An antiseptic cleaner that kills germs and bonds with the skin to continue killing germs even after washing  o Used for showering the night before surgery and morning of surgery  Before surgery, you can play an important role by reducing the number of germs on your skin.  CHG (Chlorhexidine gluconate) soap is an antiseptic cleanser which kills germs and bonds with the skin to continue killing germs even after washing.  Please do not use if you have an allergy to CHG or antibacterial soaps. If your skin becomes reddened/irritated stop using the CHG.  1. Shower the NIGHT BEFORE SURGERY and the MORNING OF SURGERY with CHG soap.  2. If you choose to wash your hair, wash your hair first as usual with your normal shampoo.  3. After shampooing, rinse your hair and body thoroughly to remove the shampoo.  4. Use CHG as you would any other liquid soap. You can apply CHG directly to the skin and wash gently with a scrungie or a clean washcloth.  5. Apply the CHG soap to your body only from the neck down. Do not use on open wounds or open sores. Avoid contact with your eyes, ears, mouth, and genitals (private parts). Wash face and genitals (private parts) with your normal  soap.  6. Wash thoroughly, paying special attention to the area where your surgery will be performed.  7. Thoroughly rinse your body with warm water.  8. Do not shower/wash with your normal soap after using and rinsing off the CHG soap.  9. Pat yourself dry with a clean towel.  10. Wear clean pajamas to bed the night before surgery.  12. Place clean sheets on your bed the night of your first shower and do not sleep with pets.  13. Shower again with the CHG soap on the day of surgery prior to arriving at the hospital.  14. Do not apply any deodorants/lotions/powders.  15. Please wear clean clothes to the hospital.

## 2023-08-31 ENCOUNTER — Encounter
Admission: RE | Admit: 2023-08-31 | Discharge: 2023-08-31 | Disposition: A | Discharge status: Home / Self Care | Payer: Medicare Other | Source: Ambulatory Visit | Attending: General Surgery | Admitting: General Surgery

## 2023-08-31 DIAGNOSIS — Z01818 Encounter for other preprocedural examination: Secondary | ICD-10-CM | POA: Insufficient documentation

## 2023-08-31 DIAGNOSIS — Z01810 Encounter for preprocedural cardiovascular examination: Secondary | ICD-10-CM | POA: Diagnosis not present

## 2023-08-31 DIAGNOSIS — D649 Anemia, unspecified: Secondary | ICD-10-CM | POA: Diagnosis not present

## 2023-08-31 DIAGNOSIS — Z01812 Encounter for preprocedural laboratory examination: Secondary | ICD-10-CM

## 2023-08-31 LAB — TYPE AND SCREEN
ABO/RH(D): A POS
Antibody Screen: NEGATIVE

## 2023-08-31 LAB — BASIC METABOLIC PANEL
Anion gap: 11 (ref 5–15)
BUN: 16 mg/dL (ref 8–23)
CO2: 24 mmol/L (ref 22–32)
Calcium: 9.2 mg/dL (ref 8.9–10.3)
Chloride: 104 mmol/L (ref 98–111)
Creatinine, Ser: 0.58 mg/dL (ref 0.44–1.00)
GFR, Estimated: >60 mL/min (ref >60)
Glucose, Bld: 85 mg/dL (ref 70–99)
Potassium: 3.9 mmol/L (ref 3.5–5.1)
Sodium: 139 mmol/L (ref 135–145)

## 2023-08-31 LAB — CBC
HCT: 36.3 % (ref 36.0–46.0)
Hemoglobin: 12.4 g/dL (ref 12.0–15.0)
MCH: 30.8 pg (ref 26.0–34.0)
MCHC: 34.2 g/dL (ref 30.0–36.0)
MCV: 90.1 fL (ref 80.0–100.0)
Platelets: 463 10*3/uL — ABNORMAL HIGH (ref 150–400)
RBC: 4.03 MIL/uL (ref 3.87–5.11)
RDW: 11.9 % (ref 11.5–15.5)
WBC: 14.7 10*3/uL — ABNORMAL HIGH (ref 4.0–10.5)
nRBC: 0 % (ref 0.0–0.2)

## 2023-09-05 MED ORDER — SODIUM CHLORIDE 0.9 % IV SOLN
2.0000 g | INTRAVENOUS | Status: AC
  Administered 2023-09-06: 2 g via INTRAVENOUS

## 2023-09-05 MED ORDER — ACETAMINOPHEN 500 MG PO TABS
1000.0000 mg | ORAL_TABLET | ORAL | Status: AC
  Administered 2023-09-06: 1000 mg via ORAL

## 2023-09-05 MED ORDER — LACTATED RINGERS IV SOLN: INTRAVENOUS | Status: DC

## 2023-09-05 MED ORDER — CHLORHEXIDINE GLUCONATE 0.12 % MT SOLN
15.0000 mL | Freq: Once | OROMUCOSAL | Status: AC
  Administered 2023-09-06: 15 mL via OROMUCOSAL

## 2023-09-05 MED ORDER — GABAPENTIN 300 MG PO CAPS
300.0000 mg | ORAL_CAPSULE | ORAL | Status: AC
  Administered 2023-09-06: 300 mg via ORAL

## 2023-09-05 MED ORDER — HEPARIN SODIUM (PORCINE) 5000 UNIT/ML IJ SOLN
5000.0000 [IU] | Freq: Once | INTRAMUSCULAR | Status: AC
  Administered 2023-09-06: 5000 [IU] via SUBCUTANEOUS

## 2023-09-05 MED ORDER — BUPIVACAINE LIPOSOME 1.3 % IJ SUSP: 20.0000 mL | Freq: Once | INTRAMUSCULAR | Status: DC

## 2023-09-05 MED ORDER — ORAL CARE MOUTH RINSE: 15.0000 mL | Freq: Once | OROMUCOSAL | Status: AC

## 2023-09-05 MED ORDER — ALVIMOPAN 12 MG PO CAPS
12.0000 mg | ORAL_CAPSULE | ORAL | Status: AC
  Administered 2023-09-06: 12 mg via ORAL

## 2023-09-06 ENCOUNTER — Encounter: Payer: Self-pay | Admitting: General Surgery

## 2023-09-06 ENCOUNTER — Other Ambulatory Visit: Payer: Self-pay

## 2023-09-06 ENCOUNTER — Encounter
Admission: RE | Disposition: A | Discharge status: Home / Self Care | Payer: Self-pay | Source: Home / Self Care | Attending: General Surgery

## 2023-09-06 ENCOUNTER — Inpatient Hospital Stay: Payer: Medicare Other | Admitting: Urgent Care

## 2023-09-06 ENCOUNTER — Inpatient Hospital Stay: Payer: Medicare Other | Admitting: Anesthesiology

## 2023-09-06 ENCOUNTER — Inpatient Hospital Stay
Admission: RE | Admit: 2023-09-06 | Discharge: 2023-09-26 | DRG: 329 | Disposition: A | Discharge status: Home / Self Care | Payer: Medicare Other | Attending: Surgery | Admitting: Surgery

## 2023-09-06 DIAGNOSIS — K651 Peritoneal abscess: Secondary | ICD-10-CM | POA: Diagnosis present

## 2023-09-06 DIAGNOSIS — Z79899 Other long term (current) drug therapy: Secondary | ICD-10-CM | POA: Diagnosis not present

## 2023-09-06 DIAGNOSIS — K565 Intestinal adhesions [bands], unspecified as to partial versus complete obstruction: Secondary | ICD-10-CM | POA: Diagnosis present

## 2023-09-06 DIAGNOSIS — K56609 Unspecified intestinal obstruction, unspecified as to partial versus complete obstruction: Secondary | ICD-10-CM

## 2023-09-06 DIAGNOSIS — Z91048 Other nonmedicinal substance allergy status: Secondary | ICD-10-CM

## 2023-09-06 DIAGNOSIS — J449 Chronic obstructive pulmonary disease, unspecified: Secondary | ICD-10-CM | POA: Diagnosis present

## 2023-09-06 DIAGNOSIS — K5780 Diverticulitis of intestine, part unspecified, with perforation and abscess without bleeding: Principal | ICD-10-CM | POA: Diagnosis present

## 2023-09-06 DIAGNOSIS — J439 Emphysema, unspecified: Secondary | ICD-10-CM | POA: Diagnosis present

## 2023-09-06 DIAGNOSIS — K5720 Diverticulitis of large intestine with perforation and abscess without bleeding: Secondary | ICD-10-CM | POA: Diagnosis present

## 2023-09-06 DIAGNOSIS — Z8249 Family history of ischemic heart disease and other diseases of the circulatory system: Secondary | ICD-10-CM | POA: Diagnosis not present

## 2023-09-06 DIAGNOSIS — G8929 Other chronic pain: Secondary | ICD-10-CM | POA: Diagnosis present

## 2023-09-06 DIAGNOSIS — K219 Gastro-esophageal reflux disease without esophagitis: Secondary | ICD-10-CM | POA: Diagnosis present

## 2023-09-06 DIAGNOSIS — M810 Age-related osteoporosis without current pathological fracture: Secondary | ICD-10-CM | POA: Diagnosis present

## 2023-09-06 DIAGNOSIS — Z882 Allergy status to sulfonamides status: Secondary | ICD-10-CM

## 2023-09-06 DIAGNOSIS — Z87891 Personal history of nicotine dependence: Secondary | ICD-10-CM

## 2023-09-06 DIAGNOSIS — Z90710 Acquired absence of both cervix and uterus: Secondary | ICD-10-CM

## 2023-09-06 DIAGNOSIS — K5651 Intestinal adhesions [bands], with partial obstruction: Secondary | ICD-10-CM | POA: Diagnosis not present

## 2023-09-06 DIAGNOSIS — Z91041 Radiographic dye allergy status: Secondary | ICD-10-CM

## 2023-09-06 LAB — ABO/RH: ABO/RH(D): A POS

## 2023-09-06 SURGERY — COLECTOMY, SIGMOID, ROBOT-ASSISTED
Anesthesia: General | Site: Abdomen

## 2023-09-06 MED ORDER — INDOCYANINE GREEN 25 MG IV SOLR
INTRAVENOUS | Status: DC | PRN
  Administered 2023-09-06: 10 mg

## 2023-09-06 MED ORDER — FENTANYL CITRATE (PF) 250 MCG/5ML IJ SOLN
INTRAMUSCULAR | Status: AC
  Filled 2023-09-06: qty 5

## 2023-09-06 MED ORDER — MIDAZOLAM HCL 2 MG/2ML IJ SOLN
INTRAMUSCULAR | Status: AC
  Filled 2023-09-06: qty 2

## 2023-09-06 MED ORDER — FENTANYL CITRATE (PF) 100 MCG/2ML IJ SOLN
25.0000 ug | INTRAMUSCULAR | Status: DC | PRN
  Administered 2023-09-06 (×2): 25 ug via INTRAVENOUS

## 2023-09-06 MED ORDER — INDOCYANINE GREEN 25 MG IV SOLR
INTRAVENOUS | Status: DC | PRN
  Administered 2023-09-06: 5 mg via INTRAVENOUS

## 2023-09-06 MED ORDER — KETOROLAC TROMETHAMINE 30 MG/ML IJ SOLN
INTRAMUSCULAR | Status: DC | PRN
  Administered 2023-09-06: 30 mg via INTRAVENOUS

## 2023-09-06 MED ORDER — HEPARIN SODIUM (PORCINE) 5000 UNIT/ML IJ SOLN
INTRAMUSCULAR | Status: AC
  Filled 2023-09-06: qty 1

## 2023-09-06 MED ORDER — SUGAMMADEX SODIUM 200 MG/2ML IV SOLN
INTRAVENOUS | Status: DC | PRN
  Administered 2023-09-06: 200 mg via INTRAVENOUS

## 2023-09-06 MED ORDER — VISTASEAL 4 ML SINGLE DOSE KIT
PACK | CUTANEOUS | Status: DC | PRN
  Administered 2023-09-06: 4 mL via TOPICAL

## 2023-09-06 MED ORDER — LACTATED RINGERS IV SOLN: INTRAVENOUS | Status: DC | PRN

## 2023-09-06 MED ORDER — PHENYLEPHRINE 80 MCG/ML (10ML) SYRINGE FOR IV PUSH (FOR BLOOD PRESSURE SUPPORT)
PREFILLED_SYRINGE | INTRAVENOUS | Status: DC | PRN
  Administered 2023-09-06 (×2): 80 ug via INTRAVENOUS

## 2023-09-06 MED ORDER — DROPERIDOL 2.5 MG/ML IJ SOLN: 0.6250 mg | Freq: Once | INTRAMUSCULAR | Status: DC | PRN

## 2023-09-06 MED ORDER — OXYCODONE HCL 5 MG/5ML PO SOLN: 5.0000 mg | Freq: Once | ORAL | Status: DC | PRN

## 2023-09-06 MED ORDER — BUPIVACAINE HCL (PF) 0.5 % IJ SOLN
INTRAMUSCULAR | Status: AC
  Filled 2023-09-06: qty 30

## 2023-09-06 MED ORDER — PANTOPRAZOLE SODIUM 40 MG IV SOLR
40.0000 mg | Freq: Every day | INTRAVENOUS | Status: DC
  Administered 2023-09-06 – 2023-09-24 (×19): 40 mg via INTRAVENOUS
  Filled 2023-09-06 (×22): qty 10

## 2023-09-06 MED ORDER — GABAPENTIN 300 MG PO CAPS
300.0000 mg | ORAL_CAPSULE | Freq: Two times a day (BID) | ORAL | Status: DC
  Administered 2023-09-06 – 2023-09-16 (×21): 300 mg via ORAL
  Filled 2023-09-06 (×22): qty 1

## 2023-09-06 MED ORDER — ONDANSETRON HCL 4 MG/2ML IJ SOLN
INTRAMUSCULAR | Status: DC | PRN
  Administered 2023-09-06: 4 mg via INTRAVENOUS

## 2023-09-06 MED ORDER — SODIUM CHLORIDE (PF) 0.9 % IJ SOLN
INTRAMUSCULAR | Status: AC
  Filled 2023-09-06: qty 50

## 2023-09-06 MED ORDER — LIDOCAINE HCL (CARDIAC) PF 100 MG/5ML IV SOSY
PREFILLED_SYRINGE | INTRAVENOUS | Status: DC | PRN
  Administered 2023-09-06: 100 mg via INTRAVENOUS

## 2023-09-06 MED ORDER — OXYCODONE HCL 5 MG PO TABS: 5.0000 mg | ORAL_TABLET | Freq: Once | ORAL | Status: DC | PRN

## 2023-09-06 MED ORDER — SODIUM CHLORIDE 0.9 % IV SOLN
INTRAVENOUS | Status: AC
  Filled 2023-09-06: qty 2

## 2023-09-06 MED ORDER — FENTANYL CITRATE (PF) 100 MCG/2ML IJ SOLN
INTRAMUSCULAR | Status: DC | PRN
  Administered 2023-09-06 (×3): 50 ug via INTRAVENOUS

## 2023-09-06 MED ORDER — SODIUM CHLORIDE (PF) 0.9 % IJ SOLN
INTRAMUSCULAR | Status: DC | PRN
  Administered 2023-09-06: 100 mL

## 2023-09-06 MED ORDER — ENOXAPARIN SODIUM 40 MG/0.4ML IJ SOSY
40.0000 mg | PREFILLED_SYRINGE | INTRAMUSCULAR | Status: DC
  Administered 2023-09-07 – 2023-09-25 (×19): 40 mg via SUBCUTANEOUS
  Filled 2023-09-06 (×19): qty 0.4

## 2023-09-06 MED ORDER — GABAPENTIN 300 MG PO CAPS
ORAL_CAPSULE | ORAL | Status: AC
  Filled 2023-09-06: qty 1

## 2023-09-06 MED ORDER — ACETAMINOPHEN 10 MG/ML IV SOLN: 1000.0000 mg | Freq: Once | INTRAVENOUS | Status: DC | PRN

## 2023-09-06 MED ORDER — ALVIMOPAN 12 MG PO CAPS
12.0000 mg | ORAL_CAPSULE | Freq: Two times a day (BID) | ORAL | Status: DC
  Administered 2023-09-07 – 2023-09-08 (×3): 12 mg via ORAL
  Filled 2023-09-06 (×3): qty 1

## 2023-09-06 MED ORDER — ALVIMOPAN 12 MG PO CAPS
ORAL_CAPSULE | ORAL | Status: AC
  Filled 2023-09-06: qty 1

## 2023-09-06 MED ORDER — SODIUM CHLORIDE 0.9 % IV SOLN: INTRAVENOUS | Status: AC

## 2023-09-06 MED ORDER — SEVOFLURANE IN SOLN
RESPIRATORY_TRACT | Status: AC
  Filled 2023-09-06: qty 250

## 2023-09-06 MED ORDER — MORPHINE SULFATE (PF) 4 MG/ML IV SOLN
4.0000 mg | INTRAVENOUS | Status: DC | PRN
  Administered 2023-09-16 – 2023-09-22 (×20): 4 mg via INTRAVENOUS
  Filled 2023-09-06 (×20): qty 1

## 2023-09-06 MED ORDER — INDOCYANINE GREEN 25 MG IV SOLR
INTRAVENOUS | Status: AC
  Filled 2023-09-06: qty 10

## 2023-09-06 MED ORDER — FENTANYL CITRATE (PF) 100 MCG/2ML IJ SOLN
INTRAMUSCULAR | Status: AC
  Filled 2023-09-06: qty 2

## 2023-09-06 MED ORDER — HYDROCODONE-ACETAMINOPHEN 5-325 MG PO TABS
1.0000 | ORAL_TABLET | ORAL | Status: DC | PRN
  Administered 2023-09-06: 1 via ORAL
  Administered 2023-09-07 – 2023-09-09 (×10): 2 via ORAL
  Administered 2023-09-09: 1 via ORAL
  Administered 2023-09-09: 2 via ORAL
  Administered 2023-09-10 – 2023-09-15 (×22): 1 via ORAL
  Administered 2023-09-15: 2 via ORAL
  Administered 2023-09-16 – 2023-09-20 (×3): 1 via ORAL
  Filled 2023-09-06 (×2): qty 2
  Filled 2023-09-06 (×3): qty 1
  Filled 2023-09-06: qty 2
  Filled 2023-09-06 (×4): qty 1
  Filled 2023-09-06: qty 2
  Filled 2023-09-06 (×2): qty 1
  Filled 2023-09-06: qty 2
  Filled 2023-09-06: qty 1
  Filled 2023-09-06: qty 2
  Filled 2023-09-06 (×3): qty 1
  Filled 2023-09-06 (×2): qty 2
  Filled 2023-09-06 (×8): qty 1
  Filled 2023-09-06: qty 2
  Filled 2023-09-06 (×5): qty 1
  Filled 2023-09-06 (×2): qty 2
  Filled 2023-09-06: qty 1
  Filled 2023-09-06: qty 2

## 2023-09-06 MED ORDER — BUPIVACAINE LIPOSOME 1.3 % IJ SUSP
INTRAMUSCULAR | Status: AC
  Filled 2023-09-06: qty 20

## 2023-09-06 MED ORDER — LABETALOL HCL 5 MG/ML IV SOLN
INTRAVENOUS | Status: DC | PRN
  Administered 2023-09-06 (×2): 5 mg via INTRAVENOUS

## 2023-09-06 MED ORDER — CHLORHEXIDINE GLUCONATE 0.12 % MT SOLN
OROMUCOSAL | Status: AC
  Filled 2023-09-06: qty 15

## 2023-09-06 MED ORDER — ONDANSETRON HCL 4 MG/2ML IJ SOLN
4.0000 mg | Freq: Four times a day (QID) | INTRAMUSCULAR | Status: DC | PRN
  Administered 2023-09-11: 4 mg via INTRAVENOUS
  Filled 2023-09-06: qty 2

## 2023-09-06 MED ORDER — PROPOFOL 10 MG/ML IV BOLUS
INTRAVENOUS | Status: DC | PRN
  Administered 2023-09-06: 150 mg via INTRAVENOUS

## 2023-09-06 MED ORDER — PROPOFOL 10 MG/ML IV BOLUS
INTRAVENOUS | Status: AC
  Filled 2023-09-06: qty 20

## 2023-09-06 MED ORDER — 0.9 % SODIUM CHLORIDE (POUR BTL) OPTIME
TOPICAL | Status: DC | PRN
  Administered 2023-09-06: 500 mL

## 2023-09-06 MED ORDER — ONDANSETRON 4 MG PO TBDP: 4.0000 mg | ORAL_TABLET | Freq: Four times a day (QID) | ORAL | Status: DC | PRN

## 2023-09-06 MED ORDER — MIDAZOLAM HCL 2 MG/2ML IJ SOLN
INTRAMUSCULAR | Status: DC | PRN
  Administered 2023-09-06: 2 mg via INTRAVENOUS

## 2023-09-06 MED ORDER — ACETAMINOPHEN 500 MG PO TABS
ORAL_TABLET | ORAL | Status: AC
  Filled 2023-09-06: qty 2

## 2023-09-06 MED ORDER — ROCURONIUM BROMIDE 100 MG/10ML IV SOLN
INTRAVENOUS | Status: DC | PRN
  Administered 2023-09-06 (×2): 20 mg via INTRAVENOUS
  Administered 2023-09-06: 50 mg via INTRAVENOUS
  Administered 2023-09-06 (×2): 20 mg via INTRAVENOUS

## 2023-09-06 MED ORDER — SODIUM CHLORIDE 0.9 % IR SOLN
Status: DC | PRN
  Administered 2023-09-06: 1000 mL

## 2023-09-06 MED ORDER — DEXAMETHASONE SODIUM PHOSPHATE 10 MG/ML IJ SOLN
INTRAMUSCULAR | Status: DC | PRN
  Administered 2023-09-06: 5 mg via INTRAVENOUS

## 2023-09-06 SURGICAL SUPPLY — 112 items
ADH SKN CLS APL DERMABOND .7 (GAUZE/BANDAGES/DRESSINGS)
APL LAPSCP 35 DL APL RGD (MISCELLANEOUS) ×2
APPLICATOR VISTASEAL 35 (MISCELLANEOUS) IMPLANT
BAG DRAIN SIEMENS DORNER NS (MISCELLANEOUS) ×2 IMPLANT
BAG DRN NS LF (MISCELLANEOUS)
BAG DRN RND TRDRP ANRFLXCHMBR (UROLOGICAL SUPPLIES) ×2
BAG URINE DRAIN 2000ML AR STRL (UROLOGICAL SUPPLIES) ×2 IMPLANT
BASIN KIT SINGLE STR (MISCELLANEOUS) ×2 IMPLANT
BLADE CLIPPER SURG (BLADE) ×2 IMPLANT
BLADE SURG SZ10 CARB STEEL (BLADE) ×2 IMPLANT
BLADE SURG SZ11 CARB STEEL (BLADE) ×2 IMPLANT
CANNULA REDUCER 12-8 DVNC XI (CANNULA) ×2 IMPLANT
CATH FOLEY 2WAY 5CC 16FR (CATHETERS) ×2
CATH URETL OPEN 5X70 (CATHETERS) ×2 IMPLANT
CATH URTH 16FR FL 2W BLN LF (CATHETERS) ×2 IMPLANT
CAUTERY HOOK MNPLR 1.6 DVNC XI (INSTRUMENTS) ×2 IMPLANT
CLIP LIGATING HEMO O LOK GREEN (MISCELLANEOUS) ×2 IMPLANT
COVER TIP SHEARS 8 DVNC (MISCELLANEOUS) ×2 IMPLANT
DERMABOND ADVANCED .7 DNX12 (GAUZE/BANDAGES/DRESSINGS) IMPLANT
DRAPE ARM DVNC X/XI (DISPOSABLE) ×8 IMPLANT
DRAPE COLUMN DVNC XI (DISPOSABLE) ×2 IMPLANT
DRAPE LEGGINS SURG 28X43 STRL (DRAPES) ×2 IMPLANT
DRAPE UNDER BUTTOCK W/FLU (DRAPES) ×2 IMPLANT
DRSG OPSITE POSTOP 4X10 (GAUZE/BANDAGES/DRESSINGS) IMPLANT
DRSG OPSITE POSTOP 4X8 (GAUZE/BANDAGES/DRESSINGS) IMPLANT
ELECT BLADE 6.5 EXT (BLADE) IMPLANT
ELECT CAUTERY BLADE 6.4 (BLADE) ×2 IMPLANT
ELECT REM PT RETURN 9FT ADLT (ELECTROSURGICAL) ×2
ELECTRODE REM PT RTRN 9FT ADLT (ELECTROSURGICAL) ×2 IMPLANT
FORCEPS BPLR FENES DVNC XI (FORCEP) ×2 IMPLANT
FORCEPS BPLR R/ABLATION 8 DVNC (INSTRUMENTS) ×2 IMPLANT
GAUZE 4X4 16PLY ~~LOC~~+RFID DBL (SPONGE) ×4 IMPLANT
GLOVE BIO SURGEON STRL SZ 6.5 (GLOVE) ×6 IMPLANT
GLOVE BIOGEL PI IND STRL 6.5 (GLOVE) ×6 IMPLANT
GLOVE SURG SYN 6.5 ES PF (GLOVE) ×2
GLOVE SURG SYN 6.5 PF PI (GLOVE) ×2 IMPLANT
GOWN STRL REUS W/ TWL LRG LVL3 (GOWN DISPOSABLE) ×14 IMPLANT
GOWN STRL REUS W/TWL LRG LVL3 (GOWN DISPOSABLE) ×14
GRASPER SUT TROCAR 14GX15 (MISCELLANEOUS) IMPLANT
GRASPER TIP-UP FEN DVNC XI (INSTRUMENTS) ×2 IMPLANT
GUIDEWIRE GREEN .038 145CM (MISCELLANEOUS) IMPLANT
GUIDEWIRE STR DUAL SENSOR (WIRE) IMPLANT
HANDLE YANKAUER SUCT BULB TIP (MISCELLANEOUS) ×2 IMPLANT
IRRIGATION STRYKERFLOW (MISCELLANEOUS) IMPLANT
IRRIGATOR STRYKERFLOW (MISCELLANEOUS)
IRRIGATOR SUCT 8 DISP DVNC XI (IRRIGATION / IRRIGATOR) IMPLANT
IV NS 1000ML (IV SOLUTION) ×2
IV NS 1000ML BAXH (IV SOLUTION) ×2 IMPLANT
IV NS IRRIG 3000ML ARTHROMATIC (IV SOLUTION) ×2 IMPLANT
IV SODIUM CHL 0.9% 250ML (IV SOLUTION) IMPLANT
KIT IMAGING PINPOINTPAQ (MISCELLANEOUS) ×2 IMPLANT
KIT PINK PAD W/HEAD ARE REST (MISCELLANEOUS) ×2
KIT PINK PAD W/HEAD ARM REST (MISCELLANEOUS) ×2 IMPLANT
LABEL OR SOLS (LABEL) IMPLANT
MANIFOLD NEPTUNE II (INSTRUMENTS) ×4 IMPLANT
NDL DRIVE SUT CUT DVNC (INSTRUMENTS) ×2 IMPLANT
NDL HYPO 22X1.5 SAFETY MO (MISCELLANEOUS) ×2 IMPLANT
NDL INSUFFLATION 14GA 120MM (NEEDLE) ×2 IMPLANT
NEEDLE DRIVE SUT CUT DVNC (INSTRUMENTS) ×2
NEEDLE HYPO 22X1.5 SAFETY MO (MISCELLANEOUS) ×2
NEEDLE INSUFFLATION 14GA 120MM (NEEDLE) ×2
OBTURATOR OPTICAL STND 8 DVNC (TROCAR) ×2
OBTURATOR OPTICALSTD 8 DVNC (TROCAR) ×2 IMPLANT
PACK COLON CLEAN CLOSURE (MISCELLANEOUS) ×2 IMPLANT
PACK CYSTO AR (MISCELLANEOUS) ×2 IMPLANT
PACK LAP CHOLECYSTECTOMY (MISCELLANEOUS) ×2 IMPLANT
PENCIL SMOKE EVACUATOR (MISCELLANEOUS) IMPLANT
PORT ACCESS TROCAR AIRSEAL 5 (TROCAR) ×2 IMPLANT
RELOAD STAPLE 45 3.5 BLU DVNC (STAPLE) IMPLANT
RELOAD STAPLE 60 3.5 BLU DVNC (STAPLE) IMPLANT
RETRACTOR RING XSMALL (MISCELLANEOUS) IMPLANT
RETRACTOR WOUND ALXS 18CM SML (MISCELLANEOUS) IMPLANT
RTRCTR WOUND ALEXIS 13CM XS SH (MISCELLANEOUS)
RTRCTR WOUND ALEXIS O 18CM SML (MISCELLANEOUS) ×2
SCISSORS MNPLR CVD DVNC XI (INSTRUMENTS) ×2 IMPLANT
SEAL UNIV 5-12 XI (MISCELLANEOUS) ×6 IMPLANT
SEALER VESSEL EXT DVNC XI (MISCELLANEOUS) IMPLANT
SET CYSTO W/LG BORE CLAMP LF (SET/KITS/TRAYS/PACK) ×2 IMPLANT
SET TRI-LUMEN FLTR TB AIRSEAL (TUBING) ×2 IMPLANT
SOL ELECTROSURG ANTI STICK (MISCELLANEOUS) ×2
SOL PREP PVP 2OZ (MISCELLANEOUS) ×2
SOLUTION ELECTROSURG ANTI STCK (MISCELLANEOUS) ×2 IMPLANT
SOLUTION PREP PVP 2OZ (MISCELLANEOUS) ×2 IMPLANT
SPONGE T-LAP 18X18 ~~LOC~~+RFID (SPONGE) ×2 IMPLANT
SPONGE T-LAP 4X18 ~~LOC~~+RFID (SPONGE) ×2 IMPLANT
STAPLER 45 SUREFORM DVNC (STAPLE) IMPLANT
STAPLER 60 SUREFORM DVNC (STAPLE) IMPLANT
STAPLER CIRCULAR MANUAL XL 29 (STAPLE) IMPLANT
STAPLER RELOAD 3.5X45 BLU DVNC (STAPLE)
STAPLER RELOAD 3.5X60 BLU DVNC (STAPLE) ×6
STAPLER RELOADABLE 65 2-0 SUT (MISCELLANEOUS) IMPLANT
STAPLER SYS INTERNAL RELOAD SS (MISCELLANEOUS) ×2
SURGILUBE 2OZ TUBE FLIPTOP (MISCELLANEOUS) ×4 IMPLANT
SUT MNCRL 4-0 (SUTURE) ×4
SUT MNCRL 4-0 27XMFL (SUTURE) ×4
SUT SILK 0 SH 30 (SUTURE) ×4 IMPLANT
SUT SILK 3-0 (SUTURE) IMPLANT
SUT STRATA 3-0 23 RB-1.5 (SUTURE) ×2 IMPLANT
SUT STRATAFIX 0 PDS+ CT-2 23 (SUTURE) ×4
SUT VIC AB 3-0 SH 27 (SUTURE) ×6
SUT VIC AB 3-0 SH 27X BRD (SUTURE) ×8 IMPLANT
SUT VICRYL 0 UR6 27IN ABS (SUTURE) ×4 IMPLANT
SUTURE MNCRL 4-0 27XMF (SUTURE) ×4 IMPLANT
SUTURE STRATFX 0 PDS+ CT-2 23 (SUTURE) ×2 IMPLANT
SYR 10ML LL (SYRINGE) ×4 IMPLANT
SYR 30ML LL (SYRINGE) ×4 IMPLANT
SYS TROCAR 1.5-3 SLV ABD GEL (ENDOMECHANICALS) ×2
SYSTEM TROCR 1.5-3 SLV ABD GEL (ENDOMECHANICALS) ×2 IMPLANT
TRAP FLUID SMOKE EVACUATOR (MISCELLANEOUS) ×4 IMPLANT
TRAY FOLEY MTR SLVR 16FR STAT (SET/KITS/TRAYS/PACK) ×2 IMPLANT
WATER STERILE IRR 1000ML POUR (IV SOLUTION) ×2 IMPLANT
WATER STERILE IRR 500ML POUR (IV SOLUTION) ×4 IMPLANT

## 2023-09-06 NOTE — Op Note (Signed)
Date of procedure: 09/06/23  Preoperative diagnosis:  Sigmoid colon stricture  Postoperative diagnosis:  Same as above  Procedure: Cystoscopy Retrograde instillation of ICG via ureteral injection for the purpose of ureteral identification  Surgeon: Vanna Scotland, MD  Anesthesia: General  Complications: None  Intraoperative findings: Slightly cloudy urine.  No additional bladder pathology identified.  EBL: Minimal  Specimens: None  Drains: None  Indication: Mariah Cooper is a 67 y.o. patient with incidental sigmoid stricture undergoing colectomy with Dr. Hazle Quant.  I was asked to assist with retrograde instillation of ICG the into the ureter for the purpose of ureteral identification.  After reviewing the management options for treatment, she elected to proceed with the above surgical procedure(s). We have discussed the potential benefits and risks of the procedure, side effects of the proposed treatment, the likelihood of the patient achieving the goals of the procedure, and any potential problems that might occur during the procedure or recuperation. Informed consent has been obtained.  Description of procedure:  The patient was taken to the operating room and general anesthesia was induced.  The patient was placed in the dorsal lithotomy position, prepped and draped in the usual sterile fashion, and preoperative antibiotics were administered. A preoperative time-out was performed.   A 21 French cystoscope was advanced per urethra into the bladder.  Notably, visualization was slightly suboptimal due to slight cloudiness of the urine but the bladder itself appeared to be relatively normal with no masses tumors or lesions.  Attention was turned to the left ureteral orifice.  This was cannulated using a 5 [place] open-ended ureteral catheter which slid easily up to about 15 cm.  This point in time, I diluted solution of 5 cc of ICG along with 5 cc of injectable saline was  instilled into the ureter while withdrawing the catheter and injecting along the way.  The same exact procedure was performed on the right.  This was uncomplicated.  There was green E flux from each of the ureters.  A 16 French Foley catheter was then placed sterilely on the field.  Patient was then cleaned and dried and the remainder of the procedure was completed by Dr. Hazle Quant.  Vanna Scotland, M.D.

## 2023-09-06 NOTE — Op Note (Signed)
Preoperative diagnosis: History of complicated diverticulitis with stricture  Postoperative diagnosis: History of complicated diverticulitis with stricture and perforation with abscess  Procedure: Robotic assisted laparoscopic sigmoid colectomy.                     Robotic assisted laparoscopic splenic flexure takedown   Anesthesia: GETA   Surgeon: Carolan Shiver, MD  Assistant: Dr. Tonna Boehringer    Wound Classification: Contaminated   Specimen: Sigmoid colon   Complications: None   Estimated Blood Loss: 150 mL   Indications: Patient is a 67 y.o. female with chronic abdominal pain she was found with stricture of the sigmoid colon due to chronic diverticulitis.   FIndings: 1.  Severe thickening of the sigmoid colon with unexpected finding of chronic perforation with mesenteric abscess 2.  Adequate hemostasis.  3.  Adequate ICG green perfusion   Description of procedure: The patient was placed on the operating table in the lithotomy position, both arms tucked. General anesthesia was induced. A time-out was completed verifying correct patient, procedure, site, positioning, and implant(s) and/or special equipment prior to beginning this procedure. The abdomen was prepped and draped in the usual sterile fashion.    A Veress needle was inserted on Palmer's point.  Abdominal cavity was insufflated to 15 mmHg. Patient tolerated insufflation well.  An 8 mm port was inserted in an Optiview fashion in the right upper quadrant.  Two additional 8 mm ports and one 12 mm port were inserted under direct visualization along the right side of the abdominal wall. 5 mm assistant port was then placed on the right subcostal area.  No injuries from trocar placements were noted. The table was placed in the Trendelenburg position.  Xi robotic platform was then brought to the operative field and docked at an angle from the left lower quadrant.  Tip up grasper and fenestrated bipolar were placed in the left arm  ports.  Scissors were placed in right arm port.   Upon entering the abdominal cavity, there was abundant amount of adhesion from the omentum to previous infraumbilical scar.  Lysis of adhesion was performed.  Then identified the sigmoid colon that was severely thickened with acute or chronic inflammation.  Upon dissection of the mesentery, an abscess cavity was entered and purulent secretion was drained.  Then a very difficult and time-consuming dissection of the colon from the pelvic wall and bladder was done.  Once I was able to mobilize the sigmoid colon to his usual anatomy, the peritoneal reflection at the sacral promontory scored.  Dissection is then taken from medial to lateral until the ureter is identified with the help of ICG green placed by urology.  Then the dissection then start to move cephalad until the inferior mesenteric artery pedicle is identified.  This is the circumferentially dissected and ligated/divided with vessel sealer.  Then medial to lateral dissection is continue cephalad up to the splenic flexure.  The splenic flexure is mobilized to release tension on the descending colon.  Then dissection is done caudally to the rectum until the rectal arteries were identified and ligated with vessel sealer.  The point of transection of the rectum is identified and dissected circumferentially.  Then the sigmoid colon is retracted medially and the lateral attachment of the descending colon are dissected for mobilization of the descending colon.  Then, 5 mg of ICG green's are flush IV.  This is down for evaluation of adequate perfusion of the colonic graft pedicle.  Adequate ICG perfusion was identified.  The point of transection were identified.  With the stapler, the sigmoid was divided at the sigmoid rectal junction distally and proximal to the diseased sigmoid colon.  Then, I scrubbed back.  A small incision was done in the periumbilical area.  Dissection was carried down to the linea alba.   Linea alba was divided and abdominal cavity entered.  Sigmoid colon was removed.  The descending colon was exteriorized.  A colotomy was done and a 29 mm anvil was placed.  The descending colon was inserted back in the abdominal cavity. Once the descending colon was identified reaching the rectum without tension, the assistant surgeon introduced the 29 mm EEA rectally. It was guided under direct vision up to the level of the distal staple line on the rectal stump. The spike of the EEA device was then deployed to pierce the rectal stump, the anvil was then attached to this spike, and the EEA device is closed and fired to perform a stapled end-to-end anastomosis. The doughnuts produced by the EEA stapler were checked to ensure that they were complete. Furthermore, an air leak test was carried out by insufflating air gently into the rectum, while the anastomosis was bathed underwater. A clamp was placed on the proximal colon to prevent its distention. Once the anastomosis was properly tested, the patient was repositioned back in the normal anatomical position. The trocars were removed under direct vision.   Using a clean closure technique, the fascia of the periumbilical incision was closed with a #0 STRATAFIX suture. The skin was closed with 4-0 Monocryl sutures in a running fashion and a dry sterile dressing is applied. The sponge and instrument count were correct, blood loss was minimal, and there were no complications.    The patient tolerated the procedure well, awakened from anesthesia and was taken to the postanesthesia care unit in satisfactory condition.  Foley still in place.  Sponge count and instrument count correct at the end of the procedure.

## 2023-09-06 NOTE — Anesthesia Procedure Notes (Signed)
Procedure Name: Intubation Date/Time: 09/06/2023 8:33 AM  Performed by: Edmund Hilda, CRNAPre-anesthesia Checklist: Patient identified, Patient being monitored, Timeout performed, Emergency Drugs available and Suction available Patient Re-evaluated:Patient Re-evaluated prior to induction Oxygen Delivery Method: Circle system utilized Preoxygenation: Pre-oxygenation with 100% oxygen Induction Type: IV induction Ventilation: Mask ventilation without difficulty Laryngoscope Size: Mac and 3 Grade View: Grade I Tube type: Oral Tube size: 7.0 mm Number of attempts: 1 Airway Equipment and Method: Stylet Placement Confirmation: ETT inserted through vocal cords under direct vision, positive ETCO2 and breath sounds checked- equal and bilateral Secured at: 21 cm Tube secured with: Tape Dental Injury: Teeth and Oropharynx as per pre-operative assessment

## 2023-09-06 NOTE — Transfer of Care (Signed)
Immediate Anesthesia Transfer of Care Note  Patient: Mariah Cooper  Procedure(s) Performed: XI ROBOT ASSISTED SIGMOID COLECTOMY (Abdomen) INDOCYANINE GREEN FLUORESCENCE IMAGING (ICG) (Bilateral)  Patient Location: PACU  Anesthesia Type:General  Level of Consciousness: awake, alert , and oriented  Airway & Oxygen Therapy: Patient Spontanous Breathing and Patient connected to face mask oxygen  Post-op Assessment: Report given to RN, Post -op Vital signs reviewed and stable, and Patient moving all extremities  Post vital signs: Reviewed and stable  Last Vitals:  Vitals Value Taken Time  BP 136/68 09/06/23 1416  Temp 36.9 C 09/06/23 1416  Pulse 85 09/06/23 1425  Resp 11 09/06/23 1425  SpO2 100 % 09/06/23 1425  Vitals shown include unfiled device data.  Last Pain:  Vitals:   09/06/23 1416  TempSrc:   PainSc: Asleep         Complications: No notable events documented.

## 2023-09-06 NOTE — Interval H&P Note (Signed)
History and Physical Interval Note:  09/06/2023 7:32 AM  Mariah Cooper  has presented today for surgery, with the diagnosis of K56.699 stricture of sigmoid colon.  The various methods of treatment have been discussed with the patient and family. After consideration of risks, benefits and other options for treatment, the patient has consented to  Procedure(s): XI ROBOT ASSISTED SIGMOID COLECTOMY (N/A) INDOCYANINE GREEN FLUORESCENCE IMAGING (ICG) (Bilateral) as a surgical intervention.  [name] history has been reviewed, patient examined, no change in status, stable for surgery.  I have reviewed the patient's chart and labs.  Questions were answered to the patient's satisfaction.     Carolan Shiver

## 2023-09-06 NOTE — Interval H&P Note (Signed)
History and Physical Interval Note:  09/06/2023 7:58 AM  Mariah Cooper  has presented today for surgery, with the diagnosis of K56.699 stricture of sigmoid colon.  The various methods of treatment have been discussed with the patient and family. After consideration of risks, benefits and other options for treatment, the patient has consented to  Procedure(s): XI ROBOT ASSISTED SIGMOID COLECTOMY (N/A) INDOCYANINE GREEN FLUORESCENCE IMAGING (ICG) (Bilateral) as a surgical intervention.  [name] history has been reviewed, patient examined, no change in status, stable for surgery.  I have reviewed the patient's chart and labs.  Questions were answered to the patient's satisfaction.    RRR CTAB  Risk of UTI, bleeding and injury all discussed.  Questions answered.    Vanna Scotland

## 2023-09-06 NOTE — Anesthesia Preprocedure Evaluation (Addendum)
Anesthesia Evaluation  Patient identified by MRN, date of birth, ID band Patient awake    Reviewed: Allergy & Precautions, NPO status , Patient's Chart, lab work & pertinent test results  History of Anesthesia Complications (+) PROLONGED EMERGENCE and history of anesthetic complications  Airway Mallampati: III  TM Distance: >3 FB Neck ROM: full    Dental  (+) Chipped, Dental Advidsory Given, Missing   Pulmonary shortness of breath and with exertion, COPD, former smoker  42 pack years  CT CHEST:  Mild diffuse bronchial wall thickening with very mild centrilobular and paraseptal emphysema; imaging findings suggestive of underlying COPD.    Pulmonary exam normal        Cardiovascular negative cardio ROS Normal cardiovascular exam     Neuro/Psych negative neurological ROS  negative psych ROS   GI/Hepatic Neg liver ROS, hiatal hernia,GERD  ,,stricture of sigmoid colon   Endo/Other  negative endocrine ROS    Renal/GU negative Renal ROS  negative genitourinary   Musculoskeletal   Abdominal Normal abdominal exam  (+)   Peds  Hematology negative hematology ROS (+)   Anesthesia Other Findings Past Medical History: No date: Anemia No date: Complication of anesthesia     Comment:  HARD TIME WAKING UP-PT DOES NOT WANT GENERAL ANESTHESIA No date: GERD (gastroesophageal reflux disease)     Comment:  WILL OCCASSIONALLY TAKE MAALOX No date: History of hiatal hernia  Past Surgical History: 01/15/2018: BREAST CYST ASPIRATION; Right     Comment:  Neg No date: ECTOPIC PREGNANCY SURGERY No date: INCONTINENCE SURGERY     Comment:  mesh sling- 2013 No date: LAPAROSCOPIC HYSTERECTOMY 08/31/2015: OPEN REDUCTION INTERNAL FIXATION (ORIF) DISTAL RADIAL  FRACTURE; Left     Comment:  Procedure: OPEN REDUCTION INTERNAL FIXATION (ORIF)               DISTAL RADIAL FRACTURE;  Surgeon: Deeann Saint, MD;                Location: [facility]  ORS;  Service: Orthopedics;  Laterality:               Left; No date: VAGINAL PROLAPSE REPAIR     Comment:  "bladder tack" after hysterectomy  BMI    Body Mass Index: 27.11 kg/m      Reproductive/Obstetrics negative OB ROS                             Anesthesia Physical Anesthesia Plan  ASA: 3  Anesthesia Plan: General   Post-op Pain Management: Tylenol PO (pre-op)*, Gabapentin PO (pre-op)*, Toradol IV (intra-op)* and Regional block*   Induction: Intravenous  PONV Risk Score and Plan: 3 and Ondansetron and Midazolam  Airway Management Planned: Oral ETT  Additional Equipment: None  Intra-op Plan:   Post-operative Plan: Extubation in OR  Informed Consent: I have reviewed the patients History and Physical, chart, labs and discussed the procedure including the risks, benefits and alternatives for the proposed anesthesia with the patient or authorized representative who has indicated his/her understanding and acceptance.     Dental advisory given  Plan Discussed with: CRNA and Surgeon  Anesthesia Plan Comments:        Anesthesia Quick Evaluation

## 2023-09-07 LAB — BASIC METABOLIC PANEL
Anion gap: 8 (ref 5–15)
BUN: 13 mg/dL (ref 8–23)
CO2: 23 mmol/L (ref 22–32)
Calcium: 8.1 mg/dL — ABNORMAL LOW (ref 8.9–10.3)
Chloride: 103 mmol/L (ref 98–111)
Creatinine, Ser: 0.84 mg/dL (ref 0.44–1.00)
GFR, Estimated: >60 mL/min (ref >60)
Glucose, Bld: 93 mg/dL (ref 70–99)
Potassium: 3.7 mmol/L (ref 3.5–5.1)
Sodium: 134 mmol/L — ABNORMAL LOW (ref 135–145)

## 2023-09-07 LAB — CBC
HCT: 33.7 % — ABNORMAL LOW (ref 36.0–46.0)
Hemoglobin: 11.1 g/dL — ABNORMAL LOW (ref 12.0–15.0)
MCH: 30.2 pg (ref 26.0–34.0)
MCHC: 32.9 g/dL (ref 30.0–36.0)
MCV: 91.8 fL (ref 80.0–100.0)
Platelets: 515 10*3/uL — ABNORMAL HIGH (ref 150–400)
RBC: 3.67 MIL/uL — ABNORMAL LOW (ref 3.87–5.11)
RDW: 12.1 % (ref 11.5–15.5)
WBC: 18.1 10*3/uL — ABNORMAL HIGH (ref 4.0–10.5)
nRBC: 0 % (ref 0.0–0.2)

## 2023-09-07 NOTE — Progress Notes (Signed)
Patient ID: Mariah Cooper, female   DOB: 1956-02-02, 67 y.o.   MRN: [id number]     SURGICAL PROGRESS NOTE   Hospital Day(s): 1.   Interval History: Patient seen and examined, no acute events or new complaints overnight. Patient reports having some soreness and lower abdominal pain.  Pain is currently controlled with pain medications.  She denies any nausea or vomiting.  She is still not passing gas.  Vital signs in last 24 hours: [min-max] current  Temp:  [98.2 F (36.8 C)-98.9 F (37.2 C)] 98.2 F (36.8 C) (11/07 1636) Pulse Rate:  [86-95] 92 (11/07 1636) Resp:  [14-20] 16 (11/07 1636) BP: (99-151)/(46-69) 151/68 (11/07 1636) SpO2:  [91 %-100 %] 98 % (11/07 1636)     Height: 5' 2.5" (158.8 cm) Weight: 64 kg BMI (Calculated): 25.36   Physical Exam:  Constitutional: alert, cooperative and no distress  Respiratory: breathing non-labored at rest  Cardiovascular: regular rate and sinus rhythm  Gastrointestinal: soft, non-tender, and non-distended  Labs:     Latest Ref Rng & Units 09/07/2023    4:30 AM 09/06/2023    5:52 PM 08/31/2023   10:10 AM  CBC  WBC 4.0 - 10.5 K/uL 18.1  23.7  14.7   Hemoglobin 12.0 - 15.0 g/dL 40.9  81.1  91.4   Hematocrit 36.0 - 46.0 % 33.7  35.8  36.3   Platelets 150 - 400 K/uL 515  463  463       Latest Ref Rng & Units 09/07/2023    4:30 AM 09/06/2023    5:52 PM 08/31/2023   10:10 AM  CMP  Glucose 70 - 99 mg/dL 93   85   BUN 8 - 23 mg/dL 13   16   Creatinine 7.82 - 1.00 mg/dL 9.56  2.13  0.86   Sodium 135 - 145 mmol/L 134   139   Potassium 3.5 - 5.1 mmol/L 3.7   3.9   Chloride 98 - 111 mmol/L 103   104   CO2 22 - 32 mmol/L 23   24   Calcium 8.9 - 10.3 mg/dL 8.1   9.2     Imaging studies: No new pertinent imaging studies   Assessment/Plan:  67 y.o. female with chronic diverticulitis with stricture and abscess 1 Day Post-Op s/p partial colectomy with colorectal anastomosis.  -Patient recovering adequately.  Today without tachycardia or  fever. -Pain currently controlled with current medication -Patient still not passing gas but no nausea or vomiting.  Will advance diet to full liquids -Continue Entereg -Continue pain management -Encouraged the patient to ambulate  Gae Gallop, MD

## 2023-09-07 NOTE — Anesthesia Postprocedure Evaluation (Signed)
Anesthesia Post Note  Patient: AKEIRA LAHM  Procedure(s) Performed: XI ROBOT ASSISTED SIGMOID COLECTOMY (Abdomen) INDOCYANINE GREEN FLUORESCENCE IMAGING (ICG) (Bilateral)  Patient location during evaluation: PACU Anesthesia Type: General Level of consciousness: awake and alert Pain management: pain level controlled Vital Signs Assessment: post-procedure vital signs reviewed and stable Respiratory status: spontaneous breathing, nonlabored ventilation and respiratory function stable Cardiovascular status: blood pressure returned to baseline and stable Postop Assessment: no apparent nausea or vomiting Anesthetic complications: no   No notable events documented.   Last Vitals:  Vitals:   09/07/23 0328 09/07/23 0842  BP: (!) 114/46 121/63  Pulse: 88 86  Resp: 20 14  Temp: 37.2 C 36.8 C  SpO2: 100% 100%    Last Pain:  Vitals:   09/07/23 1025  TempSrc:   PainSc: 5                  Foye Deer

## 2023-09-07 NOTE — Plan of Care (Signed)
  Problem: Activity: Goal: Risk for activity intolerance will decrease Outcome: Progressing   Problem: Nutrition: Goal: Adequate nutrition will be maintained Outcome: Progressing   Problem: Elimination: Goal: Will not experience complications related to urinary retention Outcome: Progressing   Problem: Pain Management: Goal: General experience of comfort will improve Outcome: Progressing   Problem: Skin Integrity: Goal: Risk for impaired skin integrity will decrease Outcome: Progressing

## 2023-09-08 LAB — CBC
HCT: 30.9 % — ABNORMAL LOW (ref 36.0–46.0)
Hemoglobin: 10.4 g/dL — ABNORMAL LOW (ref 12.0–15.0)
MCH: 30.7 pg (ref 26.0–34.0)
MCHC: 33.7 g/dL (ref 30.0–36.0)
MCV: 91.2 fL (ref 80.0–100.0)
Platelets: 470 10*3/uL — ABNORMAL HIGH (ref 150–400)
RBC: 3.39 MIL/uL — ABNORMAL LOW (ref 3.87–5.11)
RDW: 12.2 % (ref 11.5–15.5)
WBC: 27.5 10*3/uL — ABNORMAL HIGH (ref 4.0–10.5)
nRBC: 0 % (ref 0.0–0.2)

## 2023-09-08 LAB — BASIC METABOLIC PANEL
Anion gap: 7 (ref 5–15)
BUN: 10 mg/dL (ref 8–23)
CO2: 24 mmol/L (ref 22–32)
Calcium: 8.4 mg/dL — ABNORMAL LOW (ref 8.9–10.3)
Chloride: 104 mmol/L (ref 98–111)
Creatinine, Ser: 0.64 mg/dL (ref 0.44–1.00)
GFR, Estimated: >60 mL/min (ref >60)
Glucose, Bld: 132 mg/dL — ABNORMAL HIGH (ref 70–99)
Potassium: 3.7 mmol/L (ref 3.5–5.1)
Sodium: 135 mmol/L (ref 135–145)

## 2023-09-08 LAB — SURGICAL PATHOLOGY

## 2023-09-08 NOTE — Discharge Instructions (Addendum)
Food Resources  Agency Name: [facility] Agency Address: [address], [place], [place] [address] Phone: [phone] Website: [email or web address] Service(s) Offered: Housing services, self-sufficiency, congregate meal program, weatherization program, Event organiser program, emergency food assistance,  housing counseling, home ownership program, wheels - to work program.  [employer] free for 60 and older at various locations from [employer], Monday-Friday:  [employer], [address] [place], [phone] -[facility], [address] [place] [phone]  -[facility], [address], [place] [phone]  -[address], [address], [place], [phone]  Agency Name: [facility] Address: [phone] [address], [place], [place] [address] Phone: [phone] Website: [email or web address] Service(s) Offered: Home delivered hot, frozen, and emergency  meals. Grocery assistance program which matches  volunteers one-on-one with seniors unable to grocery shop  for themselves. Must be 60 years and older; less than 20  hours of in-home aide service, limited or no driving ability;  live alone or with someone with a disability; live in  [place].  Agency Name: [facility] [employer]) Address: [address], [place], [place] [address] Phone: [phone] Service(s) Offered: Food is served to shut-ins, homeless, elderly, and low income people in the community every Saturday (11:30 am-12:30 pm) and Sunday (12:30 pm-1:30pm). Volunteers also offer help and encouragement in seeking employment,  and spiritual guidance.  Agency Name: [employer] Address: [address] [place], [place] [address] Phone: [phone] Service(s) Offered: Child support services; child welfare services; food stamps; Medicaid; work first family assistance; and aid  with fuel,  rent, food and medicine.  Agency Name: [employer] Address: [address] [place], [place] Phone: [phone] Website: [email or web address] Services Offered: Monday 10:00am-12:00, 8:00pm-9:00pm, and Friday 10:00am-12:00.  Agency Name: [employer] [facility] Address: [address] [place], [place] [address] Phone: [phone] Website: [email or web address] Service(s) Offered: Serves weekday meals, open from 11:30 am- 1:00 pm., and 6:30-7:30pm, Monday-Wednesday-Friday distributes food 3:30-6pm, Monday-Wednesday-Friday.  Agency Name: [facility] Address: [address] [place], [place] Phone: [phone] Website: [email or web address] Services Offered: Distributes food the 4th Saturday of the month, starting at 8:00 am  Agency Name: [facility] Address: [phone] [place], [place] [address] Phone: [phone] Website: [email or web address] Service(s) Offered: [employer] life, weekly food pantry. Open Wednesdays from 10:00am-noon.  Agency Name: [facility] [employer] Address: [address] [place], [place], [place] Phone: [phone] Services Offered: Distributes food 9am-1pm, Monday-Thursday. Call for details.  Agency Name: [facility] Address: [address], [place], [place] [address] Phone: [phone] Website: [email or web address] Service(s) Offered: Games developer. Call for assistance.  Agency Name: [facility] Address: [address] [place], [place] [address] Phone: [phone] Service Offered: Emergency Food Pantry. Call for appointment.  Agency Name: [facility] Address: [address] [place], [place] [address] Phone: [phone] Website: [email or web address] Services Offered: Games developer. Call for details  Agency Name: [facility] Address: [address] [place], [place] Phone:  [phone] Website: [email or web address] Service(s) Offered: Emergency Food Pantry. Call for details.  Agency Name: [facility] Address: [address] [place], [place] [address] Phone: [phone] or [phone] Website: [email or web address] Service(s) Offered: Distribute food 9am-11:30 am, Tuesday-Friday, and 1-3:30pm, Monday-Friday. Food pantry Monday-Friday 1pm-3pm, fresh items, Mon.-Wed.-Fri.  Agency Name: [facility] Address: [address] [place], [place] [address] Phone: [phone] Website: [email or web address] Services Offered: Distribute food Tues and Sats from 9:00am-noon.  Closed 1st Saturday of each month. Call for details  Agency Name: [facility] [employer] Address: [place] [place] [facility] [address], [place] [address] Phone: [phone]  Services Offered: Delivers meals every Thursday   Laparoscopic surgery, Care After This sheet gives you information about how to care for yourself after your procedure. Your health care provider may also give you more specific instructions. If you have problems or questions, contact your health care provider. What can I expect after the procedure? After your procedure, it is common to have the following: Pain in your abdomen, especially in the incision areas. You will be given medicine to control the pain. Tiredness. This is a normal part of the recovery process. Your energy level will return to normal over the next several weeks. Changes in your bowel movements, such as constipation or needing to go more often. Talk with your health care provider about how to manage this. Follow these instructions at home: Medicines  tylenol and advil as needed for discomfort.  Please alternate between the two every four hours as needed for pain.    Use narcotics, if prescribed, only when tylenol and motrin is not enough to control pain.  325-650mg  every 8hrs to max of 4000mg /24hrs  (including the 325mg  in every norco dose) for the tylenol.    Advil up to 800mg  per dose every 8hrs as needed for pain.   Do not drive or use heavy machinery while taking prescription pain medicine. Do not drink alcohol while taking prescription pain medicine. If you were prescribed an antibiotic medicine, use it as told by your health care provider. Do not stop using the antibiotic even if you start to feel better. Incision care    Follow instructions from your health care provider about how to take care of your incision areas. Make sure you: Keep your incisions clean and dry. Wash your hands with soap and water before and after applying medicine to the areas, and before and after changing your bandage (dressing). If soap and water are not available, use hand sanitizer. Change your dressing as told by your health care provider. Leave stitches (sutures), skin glue, or adhesive strips in place. These skin closures may need to stay in place for 2 weeks or longer. If adhesive strip edges start to loosen and curl up, you may trim the loose edges. Do not remove adhesive strips completely unless your health care provider tells you to do that. Do not wear tight clothing over the incisions. Tight clothing may rub and irritate the incision areas, which may cause the incisions to open. Do not take baths, swim, or use a hot tub until your health care provider approves. OK TO SHOWER.   Check your incision area every day for signs of infection. Check for: More redness, swelling, or pain. More fluid or blood. Warmth. Pus or a bad smell. Activity Avoid lifting anything that is heavier than 10 lb (4.5 kg) for 2 weeks or until your health care provider says it is okay. You may resume normal activities as told by your health care provider. Ask your health care provider what activities are safe for you. Take rest breaks during the day as needed. Eating and drinking Follow instructions from your health care  provider about what you can eat after surgery. To prevent or treat constipation while you are taking prescription pain medicine, your health care provider may recommend that you: Drink enough fluid to keep your urine clear or pale yellow. Take over-the-counter or prescription  medicines. Eat foods that are high in fiber, such as fresh fruits and vegetables, whole grains, and beans. Limit foods that are high in fat and processed sugars, such as fried and sweet foods. General instructions Ask your health care provider when you will need an appointment to get your sutures or staples removed. Keep all follow-up visits as told by your health care provider. This is important. Contact a health care provider if: You have more redness, swelling, or pain around your incisions. You have more fluid or blood coming from the incisions. Your incisions feel warm to the touch. You have pus or a bad smell coming from your incisions or your dressing. You have a fever. You have an incision that breaks open (edges not staying together) after sutures or staples have been removed. Get help right away if: You develop a rash. You have chest pain or difficulty breathing. You have pain or swelling in your legs. You feel light-headed or you faint. Your abdomen swells (becomes distended). You have nausea or vomiting. You have blood in your stool (feces). This information is not intended to replace advice given to you by your health care provider. Make sure you discuss any questions you have with your health care provider. Document Released: 05/06/2005 Document Revised: 07/06/2018 Document Reviewed: 07/18/2016 Elsevier Interactive Patient Education  2019 [employer].

## 2023-09-08 NOTE — Care Management Important Message (Signed)
Important Message  Patient Details  Name: SHINE ASSELIN MRN: [id number] Date of Birth: [birth year]   Important Message Given:  N/A - LOS <3 / Initial given by admissions     Olegario Messier A Syrenity Klepacki 09/08/2023, 9:41 AM

## 2023-09-08 NOTE — Progress Notes (Signed)
Patient ID: Mariah Cooper, female   DOB: 10-Feb-1956, 67 y.o.   MRN: [id number]     SURGICAL PROGRESS NOTE   Hospital Day(s): 2.   Interval History: Patient seen and examined, no acute events or new complaints overnight. Patient reports feeling better today.  She understood that she has passed multiple gas today.  Denies any nausea or vomiting.  Endorses she tolerated full liquid diet.  She has been walking and has done 4 laps around the nurses station.  Vital signs in last 24 hours: [min-max] current  Temp:  [98.2 F (36.8 C)-100 F (37.8 C)] 99.2 F (37.3 C) (11/08 1559) Pulse Rate:  [89-107] 98 (11/08 1559) Resp:  [16-20] 20 (11/08 1559) BP: (128-153)/(56-68) 141/61 (11/08 1559) SpO2:  [92 %-100 %] 96 % (11/08 1559)     Height: 5' 2.5" (158.8 cm) Weight: 64 kg BMI (Calculated): 25.36   Physical Exam:  Constitutional: alert, cooperative and no distress  Respiratory: breathing non-labored at rest  Cardiovascular: regular rate and sinus rhythm  Gastrointestinal: soft, mild-tender, and non-distended  Labs:     Latest Ref Rng & Units 09/08/2023    5:27 AM 09/07/2023    4:30 AM 09/06/2023    5:52 PM  CBC  WBC 4.0 - 10.5 K/uL 27.5  18.1  23.7   Hemoglobin 12.0 - 15.0 g/dL 74.2  59.5  63.8   Hematocrit 36.0 - 46.0 % 30.9  33.7  35.8   Platelets 150 - 400 K/uL 470  515  463       Latest Ref Rng & Units 09/08/2023    5:27 AM 09/07/2023    4:30 AM 09/06/2023    5:52 PM  CMP  Glucose 70 - 99 mg/dL 756  93    BUN 8 - 23 mg/dL 10  13    Creatinine 4.33 - 1.00 mg/dL 2.95  1.88  4.16   Sodium 135 - 145 mmol/L 135  134    Potassium 3.5 - 5.1 mmol/L 3.7  3.7    Chloride 98 - 111 mmol/L 104  103    CO2 22 - 32 mmol/L 24  23    Calcium 8.9 - 10.3 mg/dL 8.4  8.1      Imaging studies: No new pertinent imaging studies   Assessment/Plan:  67 y.o. female with chronic diverticulitis with stricture and abscess 2 Day Post-Op s/p partial colectomy with colorectal anastomosis.    -Patient recovering slowly.  Today she had a better day passing gas and with pain improved -Still there was elevated white blood cell count, 1 episode of tachycardia -Physical exam without peritonitis.  Will continue monitoring vital signs and labs -Will keep in for liquid diet -Continue pain management -Discontinue Entereg  Gae Gallop, MD

## 2023-09-08 NOTE — TOC CM/SW Note (Signed)
Notified by RN patient is requesting resources for Meals on Wheels. Food resource list added to AVS.  Alfonso Ramus, LCSW Transitions of Care Department [phone]

## 2023-09-08 NOTE — Plan of Care (Signed)

## 2023-09-09 ENCOUNTER — Inpatient Hospital Stay: Payer: Medicare Other

## 2023-09-09 ENCOUNTER — Encounter: Payer: Self-pay | Admitting: General Surgery

## 2023-09-09 LAB — CBC
HCT: 32.6 % — ABNORMAL LOW (ref 36.0–46.0)
Hemoglobin: 11 g/dL — ABNORMAL LOW (ref 12.0–15.0)
MCH: 30.8 pg (ref 26.0–34.0)
MCHC: 33.7 g/dL (ref 30.0–36.0)
MCV: 91.3 fL (ref 80.0–100.0)
Platelets: 527 10*3/uL — ABNORMAL HIGH (ref 150–400)
RBC: 3.57 MIL/uL — ABNORMAL LOW (ref 3.87–5.11)
RDW: 12.2 % (ref 11.5–15.5)
WBC: 26.6 10*3/uL — ABNORMAL HIGH (ref 4.0–10.5)
nRBC: 0 % (ref 0.0–0.2)

## 2023-09-09 LAB — BASIC METABOLIC PANEL
Anion gap: 9 (ref 5–15)
BUN: 11 mg/dL (ref 8–23)
CO2: 27 mmol/L (ref 22–32)
Calcium: 8.8 mg/dL — ABNORMAL LOW (ref 8.9–10.3)
Chloride: 99 mmol/L (ref 98–111)
Creatinine, Ser: 0.65 mg/dL (ref 0.44–1.00)
GFR, Estimated: >60 mL/min (ref >60)
Glucose, Bld: 108 mg/dL — ABNORMAL HIGH (ref 70–99)
Potassium: 3.7 mmol/L (ref 3.5–5.1)
Sodium: 135 mmol/L (ref 135–145)

## 2023-09-09 MED ORDER — PIPERACILLIN-TAZOBACTAM 3.375 G IVPB
3.3750 g | Freq: Three times a day (TID) | INTRAVENOUS | Status: DC
  Administered 2023-09-09 – 2023-09-26 (×51): 3.375 g via INTRAVENOUS
  Filled 2023-09-09 (×51): qty 50

## 2023-09-09 MED ORDER — DIPHENHYDRAMINE HCL 25 MG PO CAPS
50.0000 mg | ORAL_CAPSULE | Freq: Once | ORAL | Status: AC
  Administered 2023-09-09: 50 mg via ORAL

## 2023-09-09 MED ORDER — IOHEXOL 300 MG/ML  SOLN
80.0000 mL | Freq: Once | INTRAMUSCULAR | Status: AC | PRN
  Administered 2023-09-09: 80 mL via INTRAVENOUS

## 2023-09-09 MED ORDER — DIPHENHYDRAMINE HCL 25 MG PO CAPS
50.0000 mg | ORAL_CAPSULE | Freq: Once | ORAL | Status: DC
  Filled 2023-09-09 (×2): qty 2

## 2023-09-09 MED ORDER — DIPHENHYDRAMINE HCL 50 MG/ML IJ SOLN: 50.0000 mg | Freq: Once | INTRAMUSCULAR | Status: DC

## 2023-09-09 MED ORDER — DIPHENHYDRAMINE HCL 50 MG/ML IJ SOLN: 50.0000 mg | Freq: Once | INTRAMUSCULAR | Status: AC

## 2023-09-09 MED ORDER — METHYLPREDNISOLONE SODIUM SUCC 40 MG IJ SOLR
40.0000 mg | Freq: Once | INTRAMUSCULAR | Status: AC
  Administered 2023-09-09: 40 mg via INTRAVENOUS
  Filled 2023-09-09: qty 1

## 2023-09-09 NOTE — Consult Note (Signed)
Pharmacy Antibiotic Note  Mariah Cooper is a 67 y.o. female admitted on 09/06/2023 with  stricture of the sigmoid colon due to chronic diverticulitis . Patient had partial colectomy with partial colorectal anastomosis 09/06/2023. WBC 26.6, plts 527, concern for intra-abdominal infection. Pharmacy has been consulted for Zosyn dosing.   Plan: Zosyn 3.375g IV q8h (4 hour infusion).  Height: 5' 2.5" (158.8 cm) Weight: 64 kg (141 lb) IBW/kg (Calculated) : 51.25  Temp (24hrs), Avg:98.7 F (37.1 C), Min:98.3 F (36.8 C), Max:99.2 F (37.3 C)  Recent Labs  Lab 09/06/23 1752 09/07/23 0430 09/08/23 0527 09/09/23 0736  WBC 23.7* 18.1* 27.5* 26.6*  CREATININE 0.66 0.84 0.64 0.65    Estimated Creatinine Clearance: 59.1 mL/min (by C-G formula based on SCr of 0.65 mg/dL).    Allergies  Allergen Reactions   Sulfa Antibiotics Itching   Ivp Dye [Iodinated Contrast Media] Rash   Tape Rash    OK TO USE PAPER TAPE    Antimicrobials this admission: 11/9 Zosyn  >>   Dose adjustments this admission:   Microbiology results:   Thank you for allowing pharmacy to be a part of this patient's care.  Karlton Lemon, PharmD Candidate  09/09/2023 10:13 AM

## 2023-09-09 NOTE — Plan of Care (Signed)
?  Problem: Clinical Measurements: ?Goal: Will remain free from infection ?Outcome: Progressing ?Goal: Diagnostic test results will improve ?Outcome: Progressing ?Goal: Respiratory complications will improve ?Outcome: Progressing ?  ?

## 2023-09-09 NOTE — Progress Notes (Signed)
Patient ID: Mariah Cooper, female   DOB: 16-Mar-1956, 67 y.o.   MRN: [id number]     SURGICAL PROGRESS NOTE   Hospital Day(s): 3.   Interval History: Patient seen and examined, no acute events or new complaints overnight. Patient reports feeling much better today.  She endorses that she has been able to pass more gas and had bowel multiple movement.  Denies any nausea.  Endorses that the abdomen feel softer.  Continue with intermittent pain.  Pain is usually controlled with current pain medications  Vital signs in last 24 hours: [min-max] current  Temp:  [98.3 F (36.8 C)-99.2 F (37.3 C)] 98.3 F (36.8 C) (11/09 0856) Pulse Rate:  [85-98] 87 (11/09 0856) Resp:  [16-20] 19 (11/09 0856) BP: (126-170)/(61-74) 134/63 (11/09 0856) SpO2:  [95 %-100 %] 98 % (11/09 0856)     Height: 5' 2.5" (158.8 cm) Weight: 64 kg BMI (Calculated): 25.36   Physical Exam:  Constitutional: alert, cooperative and no distress  Respiratory: breathing non-labored at rest  Cardiovascular: regular rate and sinus rhythm  Gastrointestinal: soft, mildly-tender, and non-distended  Labs:     Latest Ref Rng & Units 09/09/2023    7:36 AM 09/08/2023    5:27 AM 09/07/2023    4:30 AM  CBC  WBC 4.0 - 10.5 K/uL 26.6  27.5  18.1   Hemoglobin 12.0 - 15.0 g/dL 18.8  41.6  60.6   Hematocrit 36.0 - 46.0 % 32.6  30.9  33.7   Platelets 150 - 400 K/uL 527  470  515       Latest Ref Rng & Units 09/09/2023    7:36 AM 09/08/2023    5:27 AM 09/07/2023    4:30 AM  CMP  Glucose 70 - 99 mg/dL 301  601  93   BUN 8 - 23 mg/dL 11  10  13    Creatinine 0.44 - 1.00 mg/dL 0.93  2.35  5.73   Sodium 135 - 145 mmol/L 135  135  134   Potassium 3.5 - 5.1 mmol/L 3.7  3.7  3.7   Chloride 98 - 111 mmol/L 99  104  103   CO2 22 - 32 mmol/L 27  24  23    Calcium 8.9 - 10.3 mg/dL 8.8  8.4  8.1     Imaging studies: No new pertinent imaging studies   Assessment/Plan:  67 y.o. female with chronic diverticulitis with stricture and abscess 3  Day Post-Op s/p partial colectomy with colorectal anastomosis.   -Clinically the patient feeling better and was able to pass more gas and had multiple bowel movements.  There has been no fever and no tachycardia in the last 24 hours -Pain still intermittent and controlled with pain medications -Her white blood cell count continues significantly elevated -Will start empiric antibiotic therapy -Will order CT scan of the abdomen and pelvis with IV contrast to rule out intra-abdominal abscess since patient had a mesenteric abscess identified at the moment of the surgery -Encouraged the patient to ambulate -Will wait for final results of the CT scan for further decision such as advancing diet, monitor drainage or need of surgical management.  Gae Gallop, MD

## 2023-09-10 LAB — CBC
HCT: 31.4 % — ABNORMAL LOW (ref 36.0–46.0)
Hemoglobin: 10.7 g/dL — ABNORMAL LOW (ref 12.0–15.0)
MCH: 30 pg (ref 26.0–34.0)
MCHC: 34.1 g/dL (ref 30.0–36.0)
MCV: 88 fL (ref 80.0–100.0)
Platelets: 597 10*3/uL — ABNORMAL HIGH (ref 150–400)
RBC: 3.57 MIL/uL — ABNORMAL LOW (ref 3.87–5.11)
RDW: 12 % (ref 11.5–15.5)
WBC: 17.2 10*3/uL — ABNORMAL HIGH (ref 4.0–10.5)
nRBC: 0 % (ref 0.0–0.2)

## 2023-09-10 LAB — BASIC METABOLIC PANEL
Anion gap: 10 (ref 5–15)
BUN: 18 mg/dL (ref 8–23)
CO2: 27 mmol/L (ref 22–32)
Calcium: 8.7 mg/dL — ABNORMAL LOW (ref 8.9–10.3)
Chloride: 98 mmol/L (ref 98–111)
Creatinine, Ser: 0.6 mg/dL (ref 0.44–1.00)
GFR, Estimated: >60 mL/min (ref >60)
Glucose, Bld: 111 mg/dL — ABNORMAL HIGH (ref 70–99)
Potassium: 3.6 mmol/L (ref 3.5–5.1)
Sodium: 135 mmol/L (ref 135–145)

## 2023-09-10 MED ORDER — ACETAMINOPHEN 325 MG PO TABS
650.0000 mg | ORAL_TABLET | Freq: Four times a day (QID) | ORAL | Status: DC | PRN
  Filled 2023-09-10: qty 2

## 2023-09-10 NOTE — Progress Notes (Signed)
Patient ID: Mariah Cooper, female   DOB: [birth year], 67 y.o.   MRN: [id number]     SURGICAL PROGRESS NOTE   Hospital Day(s): 4.   Interval History: Patient seen and examined, no acute events or new complaints overnight. Patient reports feeling well this morning.  She endorses that she was able to pass more gas this morning.  She endorses that the pain is slowly improving.  She denies any nausea or vomiting.  She has been tolerating full liquid diet.  Patient was found with her attitude this morning.  She has been laughing and very positive.  She has been able to be ambulate around the nurses station.  Vital signs in last 24 hours: [min-max] current  Temp:  [97.6 F (36.4 C)-98.5 F (36.9 C)] 98.1 F (36.7 C) (11/10 0359) Pulse Rate:  [67-94] 67 (11/10 0359) Resp:  [19] 19 (11/09 1640) BP: (130-150)/(67-77) 150/67 (11/10 0359) SpO2:  [93 %-100 %] 97 % (11/10 0359)     Height: 5' 2.5" (158.8 cm) Weight: 64 kg BMI (Calculated): 25.36   Physical Exam:  Constitutional: alert, cooperative and no distress  Respiratory: breathing non-labored at rest  Cardiovascular: regular rate and sinus rhythm  Gastrointestinal: soft, mild-tender, and mild-distended   Labs:     Latest Ref Rng & Units 09/10/2023    7:37 AM 09/09/2023    7:36 AM 09/08/2023    5:27 AM  CBC  WBC 4.0 - 10.5 K/uL 17.2  26.6  27.5   Hemoglobin 12.0 - 15.0 g/dL 64.4  03.4  74.2   Hematocrit 36.0 - 46.0 % 31.4  32.6  30.9   Platelets 150 - 400 K/uL 597  527  470       Latest Ref Rng & Units 09/10/2023    7:37 AM 09/09/2023    7:36 AM 09/08/2023    5:27 AM  CMP  Glucose 70 - 99 mg/dL 595  638  756   BUN 8 - 23 mg/dL 18  11  10    Creatinine 0.44 - 1.00 mg/dL 4.33  2.95  1.88   Sodium 135 - 145 mmol/L 135  135  135   Potassium 3.5 - 5.1 mmol/L 3.6  3.7  3.7   Chloride 98 - 111 mmol/L 98  99  104   CO2 22 - 32 mmol/L 27  27  24    Calcium 8.9 - 10.3 mg/dL 8.7  8.8  8.4     Imaging studies: I personally evaluated  the CT scan of the abdomen and pelvis performed yesterday and discussed with radiologist.  There is a moderate amount of air that is found retroperitoneally and within the abdominal wall.  There is minimal free air.  There is no free fluid.  There is no gas surrounding the anastomosis area.  There is some small bowel dilation consistent with ileus.   Assessment/Plan:  67 y.o. female with chronic diverticulitis with stricture and abscess 4 Day Post-Op s/p partial colectomy with colorectal anastomosis.   -Patient continue stable.  Vital signs without fever, no tachycardia, adequate blood pressures -Clinically she is found with good attitude, comfortable, expected lower abdominal pain but not peritonitic -Patient continue passing gas.  She is tolerating full liquids without any nausea or vomiting -Due to the slow transit of intestine I will keep her 1 more day need for liquid diet -Significant improvement in decreasing white blood cell count.  Will continue antibiotic therapy -Encourage patient to ambulate -Continue pain management -No significant electrolyte disturbance.  Adequate renal function -I had a discussion with the patient about the CT scan finding.  The gas seen on the CT scan is mostly extraperitoneal.  Minimal intraperitoneal gas.  No significant fluid or gas surrounding the anastomosis to suggest anastomosis leak.  Since she is clinically comfortable, no peritoneal sign, passing gas I will continue with current management.  Gae Gallop, MD

## 2023-09-10 NOTE — Plan of Care (Signed)
  Problem: Education: Goal: Knowledge of General Education information will improve Description Including pain rating scale, medication(s)/side effects and non-pharmacologic comfort measures Outcome: Progressing   

## 2023-09-11 LAB — CBC
HCT: 33.2 % — ABNORMAL LOW (ref 36.0–46.0)
Hemoglobin: 11.5 g/dL — ABNORMAL LOW (ref 12.0–15.0)
MCH: 30.4 pg (ref 26.0–34.0)
MCHC: 34.6 g/dL (ref 30.0–36.0)
MCV: 87.8 fL (ref 80.0–100.0)
Platelets: 659 10*3/uL — ABNORMAL HIGH (ref 150–400)
RBC: 3.78 MIL/uL — ABNORMAL LOW (ref 3.87–5.11)
RDW: 12.3 % (ref 11.5–15.5)
WBC: 15.4 10*3/uL — ABNORMAL HIGH (ref 4.0–10.5)
nRBC: 0 % (ref 0.0–0.2)

## 2023-09-11 LAB — BASIC METABOLIC PANEL
Anion gap: 9 (ref 5–15)
BUN: 18 mg/dL (ref 8–23)
CO2: 27 mmol/L (ref 22–32)
Calcium: 8.3 mg/dL — ABNORMAL LOW (ref 8.9–10.3)
Chloride: 98 mmol/L (ref 98–111)
Creatinine, Ser: 0.62 mg/dL (ref 0.44–1.00)
GFR, Estimated: >60 mL/min (ref >60)
Glucose, Bld: 94 mg/dL (ref 70–99)
Potassium: 3.7 mmol/L (ref 3.5–5.1)
Sodium: 134 mmol/L — ABNORMAL LOW (ref 135–145)

## 2023-09-11 NOTE — Progress Notes (Signed)
Patient ID: Mariah Cooper, female   DOB: [birth year], 67 y.o.   MRN: [id number]     SURGICAL PROGRESS NOTE   Hospital Day(s): 5.   Interval History: Patient seen and examined, no acute events or new complaints overnight. Patient reports her pain improved this morning after premedications.  She also had an episode of nausea last night.  She is feeling better this morning and she is asking about having soft diet.  She endorses that she had a bowel movement and passed gas this morning.  Vital signs in last 24 hours: [min-max] current  Temp:  [98.3 F (36.8 C)-98.5 F (36.9 C)] 98.3 F (36.8 C) (11/11 0917) Pulse Rate:  [75-86] 84 (11/11 0917) Resp:  [17-18] 18 (11/11 0917) BP: (124-146)/(59-76) 146/76 (11/11 0917) SpO2:  [95 %-100 %] 96 % (11/11 0917)     Height: 5' 2.5" (158.8 cm) Weight: 64 kg BMI (Calculated): 25.36   Physical Exam:  Constitutional: alert, cooperative and no distress  Respiratory: breathing non-labored at rest  Cardiovascular: regular rate and sinus rhythm  Gastrointestinal: soft, mild tender, and mild-distended  Labs:     Latest Ref Rng & Units 09/11/2023    7:21 AM 09/10/2023    7:37 AM 09/09/2023    7:36 AM  CBC  WBC 4.0 - 10.5 K/uL 15.4  17.2  26.6   Hemoglobin 12.0 - 15.0 g/dL 40.1  02.7  25.3   Hematocrit 36.0 - 46.0 % 33.2  31.4  32.6   Platelets 150 - 400 K/uL 659  597  527       Latest Ref Rng & Units 09/11/2023    7:21 AM 09/10/2023    7:37 AM 09/09/2023    7:36 AM  CMP  Glucose 70 - 99 mg/dL 94  664  403   BUN 8 - 23 mg/dL 18  18  11    Creatinine 0.44 - 1.00 mg/dL 4.74  2.59  5.63   Sodium 135 - 145 mmol/L 134  135  135   Potassium 3.5 - 5.1 mmol/L 3.7  3.6  3.7   Chloride 98 - 111 mmol/L 98  98  99   CO2 22 - 32 mmol/L 27  27  27    Calcium 8.9 - 10.3 mg/dL 8.3  8.7  8.8     Imaging studies: No new pertinent imaging studies   Assessment/Plan:  67 y.o. female with chronic diverticulitis with stricture and abscess 5 Day Post-Op s/p  partial colectomy with colorectal anastomosis.   --Patient continue without fever or tachycardia -Stable vital signs -She did have an episode of nausea last night, intermittent pain control with current pain medications -Patient endorses feeling well this morning.  She is asking for soft diet.  She endorses that she had a bowel movement passed gas. -White blood cell continue slowly decreasing trend -My only concern is continue increasing of platelets.  This is usually a nonspecific sign of inflammatory marker. -Due to her previous CT scan with significant amount of extraperitoneal gas and patient not progressing as usual, I will continue taking it easy keeping her in full liquid diet today -Will consider CT scan tomorrow which will be 72 hours from previous CT scan for evaluation of the extraperitoneal gas versus possible intra-abdominal complication -Patient has been ambulating great.  She did 10 laps yesterday -Continue IV antibiotic therapy -  Gae Gallop, MD

## 2023-09-12 ENCOUNTER — Inpatient Hospital Stay: Payer: Medicare Other

## 2023-09-12 LAB — BASIC METABOLIC PANEL
Anion gap: 6 (ref 5–15)
BUN: 13 mg/dL (ref 8–23)
CO2: 27 mmol/L (ref 22–32)
Calcium: 8.1 mg/dL — ABNORMAL LOW (ref 8.9–10.3)
Chloride: 100 mmol/L (ref 98–111)
Creatinine, Ser: 0.69 mg/dL (ref 0.44–1.00)
GFR, Estimated: >60 mL/min (ref >60)
Glucose, Bld: 106 mg/dL — ABNORMAL HIGH (ref 70–99)
Potassium: 3.7 mmol/L (ref 3.5–5.1)
Sodium: 133 mmol/L — ABNORMAL LOW (ref 135–145)

## 2023-09-12 LAB — CBC
HCT: 31.2 % — ABNORMAL LOW (ref 36.0–46.0)
Hemoglobin: 10.7 g/dL — ABNORMAL LOW (ref 12.0–15.0)
MCH: 30.3 pg (ref 26.0–34.0)
MCHC: 34.3 g/dL (ref 30.0–36.0)
MCV: 88.4 fL (ref 80.0–100.0)
Platelets: 592 10*3/uL — ABNORMAL HIGH (ref 150–400)
RBC: 3.53 MIL/uL — ABNORMAL LOW (ref 3.87–5.11)
RDW: 12.2 % (ref 11.5–15.5)
WBC: 17 10*3/uL — ABNORMAL HIGH (ref 4.0–10.5)
nRBC: 0 % (ref 0.0–0.2)

## 2023-09-12 MED ORDER — METHYLPREDNISOLONE SODIUM SUCC 40 MG IJ SOLR
40.0000 mg | Freq: Once | INTRAMUSCULAR | Status: AC
  Administered 2023-09-12: 40 mg via INTRAVENOUS
  Filled 2023-09-12: qty 1

## 2023-09-12 MED ORDER — IOHEXOL 300 MG/ML  SOLN
80.0000 mL | Freq: Once | INTRAMUSCULAR | Status: AC | PRN
  Administered 2023-09-12: 75 mL via INTRAVENOUS

## 2023-09-12 MED ORDER — DIPHENHYDRAMINE HCL 50 MG/ML IJ SOLN
50.0000 mg | Freq: Once | INTRAMUSCULAR | Status: AC
  Administered 2023-09-12: 50 mg via INTRAVENOUS
  Filled 2023-09-12: qty 1

## 2023-09-12 MED ORDER — DIPHENHYDRAMINE HCL 25 MG PO CAPS: 50.0000 mg | ORAL_CAPSULE | Freq: Once | ORAL | Status: AC

## 2023-09-12 NOTE — Progress Notes (Signed)
Initial Nutrition Assessment  DOCUMENTATION CODES:   Not applicable  INTERVENTION:   RD will add supplements with diet advancement   Recommend TPN if unable to advance pt's diet within the next 24 hrs   Pt at high refeed risk  Daily weights   Diverticulitis diet education   NUTRITION DIAGNOSIS:   Inadequate oral intake related to acute illness as evidenced by meal completion < 25%.  GOAL:   Patient will meet greater than or equal to 90% of their needs  MONITOR:   Diet advancement, Labs, Weight trends, I & O's, Skin  REASON FOR ASSESSMENT:   NPO/Clear Liquid Diet    ASSESSMENT:   67 y/o female with h/o hiatal hernia, GERD and complicated diverticulitis with colon stricture and perforation with abscess who is s/p robotic assisted laparoscopic sigmoid colectomy with splenic flexure takedown and cystoscopy 11/6 complicated by post op ileus.  Met with pt in room today. Pt reports good appetite and oral intake at baseline but reports decreased appetite and oral in hospital r/t her acute illness. Pt was able to advance to a full liquid diet on 11/7 but developed distension yesterday and was made NPO again today. Pt eating sips/bites to 25% of meals in hospital and is now without adequate nutrition for 7 days. Pt s/p CT scan today. RD will add supplements with diet advancement. Pt reports a "whey allergy"; pt reports that whey makes her sneeze and milky supplements upset her stomach. Pt is agreeable to try vanilla Molli Posey with diet advancement. Pt is at high refeed risk. Recommend TPN if unable to advance pt's diet; this was discussed with MD. Pt provided with diverticulitis diet education today.   Per chart, pt's UBW appears to be 148-150lbs. Pt is noted to be up ~12lbs since admission but appears to be back near her UBW.   Medications reviewed and include: lovenox, protonix, zosyn   Labs reviewed: Na 133(L) Wbc- 17.0(H), Hgb 10.7(L), Hct 31.2(L)  NUTRITION - FOCUSED  PHYSICAL EXAM:  Flowsheet Row Most Recent Value  Orbital Region No depletion  Upper Arm Region No depletion  Thoracic and Lumbar Region No depletion  Buccal Region No depletion  Temple Region No depletion  Clavicle Bone Region No depletion  Clavicle and Acromion Bone Region No depletion  Scapular Bone Region No depletion  Dorsal Hand No depletion  Patellar Region Moderate depletion  Anterior Thigh Region Moderate depletion  Posterior Calf Region Moderate depletion  Edema (RD Assessment) None  Hair Reviewed  Eyes Reviewed  Mouth Reviewed  Skin Reviewed  Nails Reviewed   Diet Order:   Diet Order             Diet NPO time specified Except for: Sips with Meds  Diet effective now                  EDUCATION NEEDS:   Education needs have been addressed  Skin:  Skin Assessment: Reviewed RN Assessment (incision abdomen)  Last BM:  11/11  Height:   Ht Readings from Last 1 Encounters:  09/06/23 5' 2.5" (1.588 m)    Weight:   Wt Readings from Last 1 Encounters:  09/12/23 69.4 kg    Ideal Body Weight:  52 kg  BMI:  Body mass index is 27.56 kg/m.  Estimated Nutritional Needs:   Kcal:  1600-1800kcal/day  Protein:  80-90g/day  Fluid:  1.6-1.8L/day  Betsey Holiday MS, RD, LDN Please refer to [facility] for RD and/or RD on-call/weekend/after hours pager

## 2023-09-12 NOTE — TOC CM/SW Note (Signed)
Transition of Care [facility]) - Inpatient Brief Assessment   Patient Details  Name: Mariah Cooper MRN: [id number] Date of Birth: [birth year]  Transition of Care [facility]) CM/SW Contact:    Margarito Liner, LCSW Phone Number: 09/12/2023, 9:34 AM   Clinical Narrative: CSW reviewed chart. No TOC needs identified at this time. CSW will continue to follow progress. Please place [facility] consult if any needs arise.  Transition of Care Asessment: Insurance and Status: Insurance coverage has been reviewed Patient has primary care physician: Yes Home environment has been reviewed: Single familiy home Prior level of function:: Not documented Prior/Current Home Services: No current home services Social Determinants of Health Reivew: SDOH reviewed no interventions necessary Readmission risk has been reviewed: Yes Transition of care needs: no transition of care needs at this time

## 2023-09-12 NOTE — Progress Notes (Signed)
Patient ID: Mariah Cooper, female   DOB: 1956-09-11, 67 y.o.   MRN: [id number]     SURGICAL PROGRESS NOTE   Hospital Day(s): 6.   Interval History: Patient seen and examined, no acute events or new complaints overnight. Patient reports feeling well. She does feel more distended. She denies passing gas yesterday or last night. No vomiting. She walked 14 laps around the nurses station. No fever.   Vital signs in last 24 hours: [min-max] current  Temp:  [98.2 F (36.8 C)-98.3 F (36.8 C)] 98.3 F (36.8 C) (11/12 0417) Pulse Rate:  [75-84] 80 (11/12 0417) Resp:  [17-18] 18 (11/12 0417) BP: (126-146)/(66-76) 126/70 (11/12 0417) SpO2:  [95 %-96 %] 95 % (11/12 0417)     Height: 5' 2.5" (158.8 cm) Weight: 64 kg BMI (Calculated): 25.36   Physical Exam:  Constitutional: alert, cooperative and no distress  Respiratory: breathing non-labored at rest  Cardiovascular: regular rate and sinus rhythm  Gastrointestinal: soft, mild-tender, but distended  Labs:     Latest Ref Rng & Units 09/12/2023    5:07 AM 09/11/2023    7:21 AM 09/10/2023    7:37 AM  CBC  WBC 4.0 - 10.5 K/uL 17.0  15.4  17.2   Hemoglobin 12.0 - 15.0 g/dL 40.1  02.7  25.3   Hematocrit 36.0 - 46.0 % 31.2  33.2  31.4   Platelets 150 - 400 K/uL 592  659  597       Latest Ref Rng & Units 09/12/2023    5:07 AM 09/11/2023    7:21 AM 09/10/2023    7:37 AM  CMP  Glucose 70 - 99 mg/dL 664  94  403   BUN 8 - 23 mg/dL 13  18  18    Creatinine 0.44 - 1.00 mg/dL 4.74  2.59  5.63   Sodium 135 - 145 mmol/L 133  134  135   Potassium 3.5 - 5.1 mmol/L 3.7  3.7  3.6   Chloride 98 - 111 mmol/L 100  98  98   CO2 22 - 32 mmol/L 27  27  27    Calcium 8.9 - 10.3 mg/dL 8.1  8.3  8.7     Imaging studies: No new pertinent imaging studies   Assessment/Plan:  67 y.o. female with chronic diverticulitis with stricture and abscess 6 Day Post-Op s/p partial colectomy with colorectal anastomosis.   -Continue with stable vitals signs. No  fever, no tachycardia.  -More distended today. No passing flatus -Ileus concerns me about intra abdominal complication such as abscess. Will repeat CT scan and compare with CT done on Saturday.  -WBC count stable, Platelets minimal decrease. Adequate electrolytes and renal function.  - Will continue IV abx therapy  -Continue pain medication   Gae Gallop, MD

## 2023-09-12 NOTE — Care Management Important Message (Signed)
Important Message  Patient Details  Name: Mariah Cooper MRN: [id number] Date of Birth: December 17, 1955   Important Message Given:  Yes - Medicare IM     [name] 09/12/2023, 10:49 AM

## 2023-09-13 NOTE — Progress Notes (Signed)
Patient ID: Mariah Cooper, female   DOB: 06/24/1956, 67 y.o.   MRN: [id number]     SURGICAL PROGRESS NOTE   Hospital Day(s): 7.   Interval History: Patient seen and examined, no acute events or new complaints overnight. Patient reports feeling better this morning.  She endorses that the nausea improved.  No nausea overnight.  She endorses that she had 3 bowel movements this morning.  She endorses that she is passing gas.  She denies abdominal pain.  Vital signs in last 24 hours: [min-max] current  Temp:  [97.9 F (36.6 C)-98.4 F (36.9 C)] 98 F (36.7 C) (11/13 0751) Pulse Rate:  [66-73] 72 (11/13 0751) Resp:  [18] 18 (11/13 0751) BP: (125-155)/(54-70) 125/70 (11/13 0751) SpO2:  [96 %-100 %] 96 % (11/13 0751) Weight:  [69.4 kg] 69.4 kg (11/13 0403)     Height: 5' 2.5" (158.8 cm) Weight: 69.4 kg BMI (Calculated): 27.52   Physical Exam:  Constitutional: alert, cooperative and no distress  Respiratory: breathing non-labored at rest  Cardiovascular: regular rate and sinus rhythm  Gastrointestinal: soft, mildly-tender, and mild-distended  Labs:     Latest Ref Rng & Units 09/12/2023    5:07 AM 09/11/2023    7:21 AM 09/10/2023    7:37 AM  CBC  WBC 4.0 - 10.5 K/uL 17.0  15.4  17.2   Hemoglobin 12.0 - 15.0 g/dL 29.5  18.8  41.6   Hematocrit 36.0 - 46.0 % 31.2  33.2  31.4   Platelets 150 - 400 K/uL 592  659  597       Latest Ref Rng & Units 09/13/2023    4:52 AM 09/12/2023    5:07 AM 09/11/2023    7:21 AM  CMP  Glucose 70 - 99 mg/dL  606  94   BUN 8 - 23 mg/dL  13  18   Creatinine 3.01 - 1.00 mg/dL 6.01  0.93  2.35   Sodium 135 - 145 mmol/L  133  134   Potassium 3.5 - 5.1 mmol/L  3.7  3.7   Chloride 98 - 111 mmol/L  100  98   CO2 22 - 32 mmol/L  27  27   Calcium 8.9 - 10.3 mg/dL  8.1  8.3     Imaging studies: No new pertinent imaging studies   Assessment/Plan:  67 y.o. female with chronic diverticulitis with stricture and abscess 7 Day Post-Op s/p partial  colectomy with colorectal anastomosis.   -No fever.  No tachycardia -Abdominal physical exam to improved.  No nausea.  Patient had 3 bowel movements -Will restart clear liquid diet -Continue IV antibiotic therapy -Continue pain management -Encouraged the patient to ambulate -Will repeat labs in the morning  Gae Gallop, MD

## 2023-09-13 NOTE — Plan of Care (Signed)

## 2023-09-14 ENCOUNTER — Other Ambulatory Visit: Payer: Self-pay

## 2023-09-14 LAB — RENAL FUNCTION PANEL
Albumin: 2.6 g/dL — ABNORMAL LOW (ref 3.5–5.0)
Anion gap: 11 (ref 5–15)
BUN: 13 mg/dL (ref 8–23)
CO2: 24 mmol/L (ref 22–32)
Calcium: 8.1 mg/dL — ABNORMAL LOW (ref 8.9–10.3)
Chloride: 98 mmol/L (ref 98–111)
Creatinine, Ser: 0.64 mg/dL (ref 0.44–1.00)
GFR, Estimated: >60 mL/min (ref >60)
Glucose, Bld: 87 mg/dL (ref 70–99)
Phosphorus: 2.9 mg/dL (ref 2.5–4.6)
Potassium: 3.4 mmol/L — ABNORMAL LOW (ref 3.5–5.1)
Sodium: 133 mmol/L — ABNORMAL LOW (ref 135–145)

## 2023-09-14 LAB — CBC
HCT: 33.2 % — ABNORMAL LOW (ref 36.0–46.0)
Hemoglobin: 11 g/dL — ABNORMAL LOW (ref 12.0–15.0)
MCH: 30.1 pg (ref 26.0–34.0)
MCHC: 33.1 g/dL (ref 30.0–36.0)
MCV: 90.7 fL (ref 80.0–100.0)
Platelets: 723 10*3/uL — ABNORMAL HIGH (ref 150–400)
RBC: 3.66 MIL/uL — ABNORMAL LOW (ref 3.87–5.11)
RDW: 12 % (ref 11.5–15.5)
WBC: 14.5 10*3/uL — ABNORMAL HIGH (ref 4.0–10.5)
nRBC: 0 % (ref 0.0–0.2)

## 2023-09-14 LAB — MAGNESIUM: Magnesium: 2.5 mg/dL — ABNORMAL HIGH (ref 1.7–2.4)

## 2023-09-14 MED ORDER — SODIUM CHLORIDE 0.9% FLUSH: 10.0000 mL | INTRAVENOUS | Status: DC | PRN

## 2023-09-14 MED ORDER — SODIUM CHLORIDE 0.9% FLUSH
10.0000 mL | Freq: Two times a day (BID) | INTRAVENOUS | Status: DC
  Administered 2023-09-15 – 2023-09-18 (×7): 10 mL
  Administered 2023-09-18: 20 mL
  Administered 2023-09-19 – 2023-09-24 (×10): 10 mL

## 2023-09-14 MED ORDER — KCL IN DEXTROSE-NACL 40-5-0.9 MEQ/L-%-% IV SOLN
INTRAVENOUS | Status: AC
  Filled 2023-09-14 (×3): qty 1000

## 2023-09-14 MED ORDER — CHLORHEXIDINE GLUCONATE CLOTH 2 % EX PADS
6.0000 | MEDICATED_PAD | Freq: Every day | CUTANEOUS | Status: DC
  Administered 2023-09-15 – 2023-09-25 (×11): 6 via TOPICAL

## 2023-09-14 NOTE — Progress Notes (Signed)
Peripherally Inserted Central Catheter Placement  The IV Nurse has discussed with the patient and/or persons authorized to consent for the patient, the purpose of this procedure and the potential benefits and risks involved with this procedure.  The benefits include less needle sticks, lab draws from the catheter, and the patient may be discharged home with the catheter. Risks include, but not limited to, infection, bleeding, blood clot (thrombus formation), and puncture of an artery; nerve damage and irregular heartbeat and possibility to perform a PICC exchange if needed/ordered by physician.  Alternatives to this procedure were also discussed.  Bard Power PICC patient education guide, fact sheet on infection prevention and patient information card has been provided to patient /or left at bedside.    PICC Placement Documentation  PICC Double Lumen 09/14/23 Right Basilic 39 cm 1 cm (Active)  Indication for Insertion or Continuance of Line Administration of hyperosmolar/irritating solutions (i.e. TPN, Vancomycin, etc.) 09/14/23 2330  Exposed Catheter (cm) 0 cm 09/14/23 2330  Site Assessment Clean, Dry, Intact 09/14/23 2330  Lumen #1 Status Flushed;Saline locked;Blood return noted 09/14/23 2330  Lumen #2 Status Flushed;Saline locked;Blood return noted 09/14/23 2330  Dressing Type Transparent;Securing device 09/14/23 2330  Dressing Status Antimicrobial disc in place 09/14/23 2330  Line Care Connections checked and tightened 09/14/23 2330  Line Adjustment (NICU/IV Team Only) No 09/14/23 2330  Dressing Intervention New dressing;Adhesive placed at insertion site (IV team only) 09/14/23 2330  Dressing Change Due 09/21/23 09/14/23 2330       Mariah Cooper 09/14/2023, 11:32 PM

## 2023-09-14 NOTE — Progress Notes (Signed)
Nutrition Follow Up Note   DOCUMENTATION CODES:   Not applicable  INTERVENTION:   TPN per pharmacy   Recommend thiamine 100mg  IV daily x 5 days   Pt at high refeed risk; recommend monitor potassium, magnesium and phosphorus labs daily until stable  Daily weights   NUTRITION DIAGNOSIS:   Inadequate oral intake related to acute illness as evidenced by meal completion < 25%. -ongoing   GOAL:   Patient will meet greater than or equal to 90% of their needs -not met   MONITOR:   PO intake, Diet advancement, Labs, Weight trends, Skin, I & O's, Other (Comment) (TPN)  ASSESSMENT:   67 y/o female with h/o hiatal hernia, GERD and complicated diverticulitis with colon stricture and perforation with abscess who is s/p robotic assisted laparoscopic sigmoid colectomy with splenic flexure takedown and cystoscopy 11/6 complicated by post op ileus.  -CT scan from 11/12 reports small bowel obstruction vs worsening postoperative ileus   Pt with ongoing post op ileus. Pt remains on clear liquid diet and is now without adequate nutrition for > 7 days. Plan is for PICC line and TPN tomorrow. Pt is at high refeed risk. No BM or flatus for the past 24 hrs. Pt denies any nausea or pain today. Per chart, pt is down ~4lbs(2%) from her UBW.   Medications reviewed and include: lovenox, protonix, zosyn, NaCl w/ Kcl & 5% dextrose @75ml /hr   Labs reviewed: Na 133(L), K 3.4(L), P 2.9 wnl, Mg 2.5(H) Wbc- 14.5(H), Hgb 11.0(L), Hct 33.2(L)  Diet Order:   Diet Order             Diet clear liquid Room service appropriate? Yes; Fluid consistency: Thin  Diet effective now                  EDUCATION NEEDS:   Education needs have been addressed  Skin:  Skin Assessment: Reviewed RN Assessment (incision abdomen)  Last BM:  11/13  Height:   Ht Readings from Last 1 Encounters:  09/06/23 5' 2.5" (1.588 m)    Weight:   Wt Readings from Last 1 Encounters:  09/14/23 65.6 kg    Ideal Body  Weight:  52 kg  BMI:  Body mass index is 26.03 kg/m.  Estimated Nutritional Needs:   Kcal:  1600-1800kcal/day  Protein:  80-90g/day  Fluid:  1.6-1.8L/day  Betsey Holiday MS, RD, LDN Please refer to [facility] for RD and/or RD on-call/weekend/after hours pager

## 2023-09-14 NOTE — Progress Notes (Signed)
Patient ID: Mariah Cooper, female   DOB: 1956/02/19, 67 y.o.   MRN: [id number]     SURGICAL PROGRESS NOTE   Hospital Day(s): 8.   Interval History: Patient seen and examined, no acute events or new complaints overnight. Patient reports feeling the same.  The patient endorses that there has been no changes since yesterday.  She does not feel bad.  She does not feel pain.  She is does not feel nauseated.  She endorsed that she is still not passing gas or had any bowel movement in the last 24 hours.  Vital signs in last 24 hours: [min-max] current  Temp:  [97.9 F (36.6 C)-98.8 F (37.1 C)] 98.8 F (37.1 C) (11/14 0817) Pulse Rate:  [67-79] 71 (11/14 0817) Resp:  [16-18] 16 (11/14 0817) BP: (122-140)/(56-63) 140/63 (11/14 0817) SpO2:  [96 %-100 %] 100 % (11/14 0817) Weight:  [65.6 kg] 65.6 kg (11/14 0350)     Height: 5' 2.5" (158.8 cm) Weight: 65.6 kg BMI (Calculated): 26.01   Physical Exam:  Constitutional: alert, cooperative and no distress  Respiratory: breathing non-labored at rest  Cardiovascular: regular rate and sinus rhythm  Gastrointestinal: soft, non-tender, and non-distended  Labs:     Latest Ref Rng & Units 09/14/2023    7:49 AM 09/12/2023    5:07 AM 09/11/2023    7:21 AM  CBC  WBC 4.0 - 10.5 K/uL 14.5  17.0  15.4   Hemoglobin 12.0 - 15.0 g/dL 08.6  57.8  46.9   Hematocrit 36.0 - 46.0 % 33.2  31.2  33.2   Platelets 150 - 400 K/uL 723  592  659       Latest Ref Rng & Units 09/14/2023    7:49 AM 09/12/2023    5:07 AM 09/11/2023    7:21 AM  CMP  Glucose 70 - 99 mg/dL 87  629  94   BUN 8 - 23 mg/dL 13  13  18    Creatinine 0.44 - 1.00 mg/dL 5.28  4.13  2.44   Sodium 135 - 145 mmol/L 133  133  134   Potassium 3.5 - 5.1 mmol/L 3.4  3.7  3.7   Chloride 98 - 111 mmol/L 98  100  98   CO2 22 - 32 mmol/L 24  27  27    Calcium 8.9 - 10.3 mg/dL 8.1  8.1  8.3     Imaging studies: No new pertinent imaging studies   Assessment/Plan:  67 y.o. female with chronic  diverticulitis with stricture and abscess 8 Day Post-Op s/p partial colectomy with colorectal anastomosis.   -No fever again, no tachycardia. -Still with prolonged ileus versus bowel obstruction -Minimal decrease in white blood cell count.  Continue increasing platelet. -Abdominal physical exam is benign.  The abdomen is mildly distended and tympanic but nontender to palpation.  Patient is a peritonitic. -Will start TPN for parenteral nutrition -Will continue IV antibiotic therapy -Continue encouraging the patient to ambulate -Continue pain management  Gae Gallop, MD

## 2023-09-15 LAB — HEMOGLOBIN A1C
Hgb A1c MFr Bld: 6.3 % — ABNORMAL HIGH (ref 4.8–5.6)
Mean Plasma Glucose: 134.11 mg/dL

## 2023-09-15 LAB — COMPREHENSIVE METABOLIC PANEL
ALT: 23 U/L (ref 0–44)
AST: 18 U/L (ref 15–41)
Albumin: 2.7 g/dL — ABNORMAL LOW (ref 3.5–5.0)
Alkaline Phosphatase: 90 U/L (ref 38–126)
Anion gap: 8 (ref 5–15)
BUN: 9 mg/dL (ref 8–23)
CO2: 24 mmol/L (ref 22–32)
Calcium: 8.2 mg/dL — ABNORMAL LOW (ref 8.9–10.3)
Chloride: 102 mmol/L (ref 98–111)
Creatinine, Ser: 0.56 mg/dL (ref 0.44–1.00)
GFR, Estimated: >60 mL/min (ref >60)
Glucose, Bld: 97 mg/dL (ref 70–99)
Potassium: 3.7 mmol/L (ref 3.5–5.1)
Sodium: 134 mmol/L — ABNORMAL LOW (ref 135–145)
Total Bilirubin: 0.8 mg/dL (ref <1.2)
Total Protein: 5.8 g/dL — ABNORMAL LOW (ref 6.5–8.1)

## 2023-09-15 LAB — GLUCOSE, CAPILLARY: Glucose-Capillary: 165 mg/dL — ABNORMAL HIGH (ref 70–99)

## 2023-09-15 LAB — MAGNESIUM: Magnesium: 2.6 mg/dL — ABNORMAL HIGH (ref 1.7–2.4)

## 2023-09-15 LAB — PHOSPHORUS: Phosphorus: 2.5 mg/dL (ref 2.5–4.6)

## 2023-09-15 MED ORDER — THIAMINE HCL 100 MG/ML IJ SOLN
100.0000 mg | Freq: Every day | INTRAMUSCULAR | Status: AC
  Administered 2023-09-15 – 2023-09-19 (×5): 100 mg via INTRAVENOUS
  Filled 2023-09-15 (×2): qty 2
  Filled 2023-09-15: qty 1
  Filled 2023-09-15 (×3): qty 2

## 2023-09-15 MED ORDER — TRACE MINERALS CU-MN-SE-ZN 300-55-60-3000 MCG/ML IV SOLN
INTRAVENOUS | Status: AC
  Filled 2023-09-15: qty 1000

## 2023-09-15 MED ORDER — KATE FARMS STANDARD 1.4 PO LIQD
325.0000 mL | Freq: Two times a day (BID) | ORAL | Status: DC
  Administered 2023-09-15: 325 mL via ORAL
  Filled 2023-09-15: qty 325

## 2023-09-15 MED ORDER — FAT EMUL FISH OIL/PLANT BASED 20% (SMOFLIPID)IV EMUL
250.0000 mL | INTRAVENOUS | Status: AC
  Administered 2023-09-15: 250 mL via INTRAVENOUS
  Filled 2023-09-15: qty 250

## 2023-09-15 MED ORDER — INSULIN ASPART 100 UNIT/ML IJ SOLN
0.0000 [IU] | Freq: Four times a day (QID) | INTRAMUSCULAR | Status: DC
  Administered 2023-09-15: 2 [IU] via SUBCUTANEOUS
  Administered 2023-09-16: 1 [IU] via SUBCUTANEOUS
  Administered 2023-09-16 – 2023-09-17 (×2): 2 [IU] via SUBCUTANEOUS
  Administered 2023-09-18 – 2023-09-21 (×4): 1 [IU] via SUBCUTANEOUS
  Administered 2023-09-21: 2 [IU] via SUBCUTANEOUS
  Filled 2023-09-15 (×10): qty 1

## 2023-09-15 NOTE — Plan of Care (Signed)

## 2023-09-15 NOTE — Progress Notes (Signed)
Patient ID: Mariah Cooper, female   DOB: [birth year], 67 y.o.   MRN: [id number]     SURGICAL PROGRESS NOTE   Hospital Day(s): 9.   Interval History: Patient seen and examined, no acute events or new complaints overnight. Patient reports she is feeling very happy today because she passed gas last night and she had a good bowel movement.  She denies any nausea or vomiting despite drinking clear liquids.  She feels much better.  She was able to ambulate multiple times around the nurses station.  Vital signs in last 24 hours: [min-max] current  Temp:  [97.8 F (36.6 C)-98.3 F (36.8 C)] 97.8 F (36.6 C) (11/15 0817) Pulse Rate:  [69-88] 88 (11/15 0817) Resp:  [15-20] 15 (11/15 0817) BP: (122-152)/(62-74) 122/74 (11/15 0817) SpO2:  [95 %-100 %] 100 % (11/15 0817) Weight:  [67.7 kg] 67.7 kg (11/15 0457)     Height: 5' 2.5" (158.8 cm) Weight: 67.7 kg BMI (Calculated): 26.85   Physical Exam:  Constitutional: alert, cooperative and no distress  Respiratory: breathing non-labored at rest  Cardiovascular: regular rate and sinus rhythm  Gastrointestinal: soft, non-tender, and minimal-distended  Labs:     Latest Ref Rng & Units 09/14/2023    7:49 AM 09/12/2023    5:07 AM 09/11/2023    7:21 AM  CBC  WBC 4.0 - 10.5 K/uL 14.5  17.0  15.4   Hemoglobin 12.0 - 15.0 g/dL 62.1  30.8  65.7   Hematocrit 36.0 - 46.0 % 33.2  31.2  33.2   Platelets 150 - 400 K/uL 723  592  659       Latest Ref Rng & Units 09/15/2023    3:57 AM 09/14/2023    7:49 AM 09/12/2023    5:07 AM  CMP  Glucose 70 - 99 mg/dL 97  87  846   BUN 8 - 23 mg/dL 9  13  13    Creatinine 0.44 - 1.00 mg/dL 9.62  9.52  8.41   Sodium 135 - 145 mmol/L 134  133  133   Potassium 3.5 - 5.1 mmol/L 3.7  3.4  3.7   Chloride 98 - 111 mmol/L 102  98  100   CO2 22 - 32 mmol/L 24  24  27    Calcium 8.9 - 10.3 mg/dL 8.2  8.1  8.1   Total Protein 6.5 - 8.1 g/dL 5.8     Total Bilirubin <1.2 mg/dL 0.8     Alkaline Phos 38 - 126 U/L 90      AST 15 - 41 U/L 18     ALT 0 - 44 U/L 23       Imaging studies: No new pertinent imaging studies   Assessment/Plan:  67 y.o. female with chronic diverticulitis with stricture and abscess 9 Day Post-Op s/p partial colectomy with colorectal anastomosis.   -Patient continues stable, no clinical alteration -Actually today the patient is feeling much better.  She had a good bowel movement and passing gas.  On physical exam her abdomen is less distended. -She is very hungry and requesting to get her diet advance.  Will advance diet to full liquids encouraging her to eat small amount -Will start TPN today -Continue IV antibiotic therapy -Continue pain management -Continue with serial physical exams  Gae Gallop, MD

## 2023-09-15 NOTE — Care Management Important Message (Signed)
Important Message  Patient Details  Name: Mariah Cooper MRN: [id number] Date of Birth: Nov 09, 1955   Important Message Given:  Yes - Medicare IM     Olegario Messier A Kadiatou Oplinger 09/15/2023, 3:11 PM

## 2023-09-15 NOTE — Plan of Care (Signed)
  Problem: Health Behavior/Discharge Planning: Goal: Ability to manage health-related needs will improve Outcome: Progressing   

## 2023-09-15 NOTE — Consult Note (Signed)
PHARMACY - TOTAL PARENTERAL NUTRITION CONSULT NOTE   Indication: Prolonged ileus  Patient Measurements: Height: 5' 2.5" (158.8 cm) Weight: 67.7 kg (149 lb 4 oz) IBW/kg (Calculated) : 51.25 TPN AdjBW (KG): 64 Body mass index is 26.86 kg/m. Usual Weight: ~69 kg  Assessment:  67 y.o. female with chronic diverticulitis with stricture and abscess  s/p partial colectomy with colorectal anastomosis done on 09/06/23. Per surgeon, patient has been able to pass gas and had a bowel movement, but unable to advance diet. Stated that although she is very hungry, she's unable to keep anything down. Pharmacy has been consulted for TPN assistance until patient's diet is able to be advanced  Glucose / Insulin: BG 87-106 / No insulin requirements currently Electrolytes: No replacement currently indicated Renal: Scr<1, stable Hepatic: AST/ALT wnl Intake / Output; MIVF: +743 mL / D5NS+42mEqK @ 70ml/hr GI Imaging: 10/18 CT abdomen/pelvis:  1. Severe sigmoid colon diverticulosis with long segment of abnormal wall thickening corresponding to the area of diverticular stricture identified on recent barium enema from 07/31/2023. Imaging findings are compatible with severe chronic diverticular disease. No significant surrounding inflammatory changes or free fluid noted at this time. 11/09 repeat CT abdomen/pelvis:  1. Postop change from sigmoid resection and colorectal anastomosis. No specific findings identified to suggest anastomotic dehiscence. 2. Small fluid collection along the right iliac fossa measures 2.8 by 1.1 by 3.1 cm (volume = 5 cc). This may represent a postoperative seroma or hematoma. Abscess is considered less likely.  GI Surgeries / Procedures:    Central access: PICC TPN start date: 09/15/2023  Nutritional Goals: Due to current fluid shortage, goal TPN rate has been modified to 42 mL/hr (provides 80 g of protein and 1169 kcals per day)  Will continue to monitor fluid crisis and adjust  goals as appropriate.  RD Assessment: Estimated Needs Total Energy Estimated Needs: 1600-1800kcal/day Total Protein Estimated Needs: 80-90g/day Total Fluid Estimated Needs: 1.6-1.8L/day  Current Nutrition:  Full liquids  Plan:  Start TPN at 42 mL/hr at 1800 and  Plan for 20% fat emulsion(SMOFLIPID) infusion to be ordered 5 days a week(likely Monday-Friday with weekend holiday) Electrolytes in TPN: Na 77mEq/L, K 6mEq/L, Ca 85mEq/L, Mg 83mEq/L, and Phos 42mmol/L. Cl:Ac 1:1 Add standard MVI and trace elements to TPN Thiamine 100mg  IV x 5 days ordered Initiate Sensitive q6h SSI and adjust as needed  Plan to stop IVF once TPN is initiated Monitor TPN labs until stable, then biweekly on Mon/Thurs  Mariah Cooper 09/15/2023,9:47 AM

## 2023-09-16 ENCOUNTER — Inpatient Hospital Stay: Payer: Medicare Other | Admitting: Anesthesiology

## 2023-09-16 ENCOUNTER — Other Ambulatory Visit: Payer: Self-pay

## 2023-09-16 ENCOUNTER — Encounter
Admission: RE | Disposition: A | Discharge status: Home / Self Care | Payer: Self-pay | Source: Home / Self Care | Attending: General Surgery

## 2023-09-16 ENCOUNTER — Inpatient Hospital Stay: Payer: Medicare Other

## 2023-09-16 HISTORY — PX: XI ROBOTIC ASSISTED SMALL BOWEL RESECTION: SHX6872

## 2023-09-16 HISTORY — PX: ROBOTIC ASSISTED LAPAROSCOPIC LYSIS OF ADHESION: SHX6080

## 2023-09-16 LAB — BASIC METABOLIC PANEL
Anion gap: 7 (ref 5–15)
BUN: 10 mg/dL (ref 8–23)
CO2: 23 mmol/L (ref 22–32)
Calcium: 8.2 mg/dL — ABNORMAL LOW (ref 8.9–10.3)
Chloride: 105 mmol/L (ref 98–111)
Creatinine, Ser: 0.53 mg/dL (ref 0.44–1.00)
GFR, Estimated: >60 mL/min (ref >60)
Glucose, Bld: 145 mg/dL — ABNORMAL HIGH (ref 70–99)
Potassium: 3.8 mmol/L (ref 3.5–5.1)
Sodium: 135 mmol/L (ref 135–145)

## 2023-09-16 LAB — GLUCOSE, CAPILLARY
Glucose-Capillary: 110 mg/dL — ABNORMAL HIGH (ref 70–99)
Glucose-Capillary: 143 mg/dL — ABNORMAL HIGH (ref 70–99)
Glucose-Capillary: 170 mg/dL — ABNORMAL HIGH (ref 70–99)
Glucose-Capillary: 182 mg/dL — ABNORMAL HIGH (ref 70–99)

## 2023-09-16 LAB — CBC
HCT: 31.8 % — ABNORMAL LOW (ref 36.0–46.0)
Hemoglobin: 10.5 g/dL — ABNORMAL LOW (ref 12.0–15.0)
MCH: 30.3 pg (ref 26.0–34.0)
MCHC: 33 g/dL (ref 30.0–36.0)
MCV: 91.6 fL (ref 80.0–100.0)
Platelets: 630 10*3/uL — ABNORMAL HIGH (ref 150–400)
RBC: 3.47 MIL/uL — ABNORMAL LOW (ref 3.87–5.11)
RDW: 12.4 % (ref 11.5–15.5)
WBC: 11.5 10*3/uL — ABNORMAL HIGH (ref 4.0–10.5)
nRBC: 0 % (ref 0.0–0.2)

## 2023-09-16 LAB — PHOSPHORUS: Phosphorus: 2.2 mg/dL — ABNORMAL LOW (ref 2.5–4.6)

## 2023-09-16 LAB — MAGNESIUM: Magnesium: 2.5 mg/dL — ABNORMAL HIGH (ref 1.7–2.4)

## 2023-09-16 SURGERY — EXCISION, SMALL INTESTINE, ROBOT-ASSISTED
Anesthesia: General

## 2023-09-16 MED ORDER — FENTANYL CITRATE (PF) 100 MCG/2ML IJ SOLN
INTRAMUSCULAR | Status: AC
  Filled 2023-09-16: qty 2

## 2023-09-16 MED ORDER — DIPHENHYDRAMINE HCL 25 MG PO CAPS: 50.0000 mg | ORAL_CAPSULE | Freq: Once | ORAL | Status: DC

## 2023-09-16 MED ORDER — SODIUM CHLORIDE 0.9 % IV SOLN
INTRAVENOUS | Status: DC | PRN
  Administered 2023-09-16: 2 g via INTRAVENOUS

## 2023-09-16 MED ORDER — MIDAZOLAM HCL 2 MG/2ML IJ SOLN
INTRAMUSCULAR | Status: AC
Start: 2023-09-16 — End: ?
  Filled 2023-09-16: qty 2

## 2023-09-16 MED ORDER — FAT EMUL FISH OIL/PLANT BASED 20% (SMOFLIPID)IV EMUL
250.0000 mL | INTRAVENOUS | Status: AC
  Administered 2023-09-16: 250 mL via INTRAVENOUS
  Filled 2023-09-16: qty 250

## 2023-09-16 MED ORDER — BUPIVACAINE LIPOSOME 1.3 % IJ SUSP
INTRAMUSCULAR | Status: DC | PRN
  Administered 2023-09-16: 50 mL

## 2023-09-16 MED ORDER — PROPOFOL 10 MG/ML IV BOLUS
INTRAVENOUS | Status: DC | PRN
  Administered 2023-09-16: 100 mg via INTRAVENOUS

## 2023-09-16 MED ORDER — BUPIVACAINE LIPOSOME 1.3 % IJ SUSP
INTRAMUSCULAR | Status: AC
  Filled 2023-09-16: qty 20

## 2023-09-16 MED ORDER — PHENYLEPHRINE 80 MCG/ML (10ML) SYRINGE FOR IV PUSH (FOR BLOOD PRESSURE SUPPORT)
PREFILLED_SYRINGE | INTRAVENOUS | Status: DC | PRN
  Administered 2023-09-16: 80 ug via INTRAVENOUS
  Administered 2023-09-16 (×3): 160 ug via INTRAVENOUS

## 2023-09-16 MED ORDER — SUCCINYLCHOLINE CHLORIDE 200 MG/10ML IV SOSY
PREFILLED_SYRINGE | INTRAVENOUS | Status: AC
Start: 2023-09-16 — End: ?
  Filled 2023-09-16: qty 10

## 2023-09-16 MED ORDER — CEFOTETAN DISODIUM 2 G IJ SOLR
INTRAMUSCULAR | Status: AC
  Filled 2023-09-16: qty 2

## 2023-09-16 MED ORDER — LIDOCAINE HCL (CARDIAC) PF 100 MG/5ML IV SOSY
PREFILLED_SYRINGE | INTRAVENOUS | Status: DC | PRN
  Administered 2023-09-16: 60 mg via INTRAVENOUS

## 2023-09-16 MED ORDER — TRACE MINERALS CU-MN-SE-ZN 300-55-60-3000 MCG/ML IV SOLN
INTRAVENOUS | Status: AC
  Filled 2023-09-16: qty 1000

## 2023-09-16 MED ORDER — DEXAMETHASONE SODIUM PHOSPHATE 10 MG/ML IJ SOLN
INTRAMUSCULAR | Status: DC | PRN
  Administered 2023-09-16: 10 mg via INTRAVENOUS

## 2023-09-16 MED ORDER — SODIUM CHLORIDE 0.9 % IV SOLN: INTRAVENOUS | Status: DC | PRN

## 2023-09-16 MED ORDER — FENTANYL CITRATE (PF) 100 MCG/2ML IJ SOLN
25.0000 ug | INTRAMUSCULAR | Status: DC | PRN
  Administered 2023-09-16: 25 ug via INTRAVENOUS
  Administered 2023-09-16: 50 ug via INTRAVENOUS
  Administered 2023-09-16: 25 ug via INTRAVENOUS

## 2023-09-16 MED ORDER — DEXAMETHASONE SODIUM PHOSPHATE 10 MG/ML IJ SOLN
INTRAMUSCULAR | Status: AC
  Filled 2023-09-16: qty 1

## 2023-09-16 MED ORDER — 0.9 % SODIUM CHLORIDE (POUR BTL) OPTIME
TOPICAL | Status: DC | PRN
  Administered 2023-09-16: 500 mL

## 2023-09-16 MED ORDER — BUPIVACAINE HCL (PF) 0.5 % IJ SOLN
INTRAMUSCULAR | Status: AC
Start: 2023-09-16 — End: ?
  Filled 2023-09-16: qty 30

## 2023-09-16 MED ORDER — FENTANYL CITRATE (PF) 100 MCG/2ML IJ SOLN
INTRAMUSCULAR | Status: DC | PRN
  Administered 2023-09-16 (×3): 50 ug via INTRAVENOUS

## 2023-09-16 MED ORDER — PHENYLEPHRINE 80 MCG/ML (10ML) SYRINGE FOR IV PUSH (FOR BLOOD PRESSURE SUPPORT)
PREFILLED_SYRINGE | INTRAVENOUS | Status: AC
Start: 2023-09-16 — End: ?
  Filled 2023-09-16: qty 10

## 2023-09-16 MED ORDER — OXYCODONE HCL 5 MG PO TABS: 5.0000 mg | ORAL_TABLET | Freq: Once | ORAL | Status: DC | PRN

## 2023-09-16 MED ORDER — PROPOFOL 10 MG/ML IV BOLUS
INTRAVENOUS | Status: AC
  Filled 2023-09-16: qty 20

## 2023-09-16 MED ORDER — ROCURONIUM BROMIDE 10 MG/ML (PF) SYRINGE
PREFILLED_SYRINGE | INTRAVENOUS | Status: AC
  Filled 2023-09-16: qty 10

## 2023-09-16 MED ORDER — SUGAMMADEX SODIUM 200 MG/2ML IV SOLN
INTRAVENOUS | Status: DC | PRN
  Administered 2023-09-16: 200 mg via INTRAVENOUS

## 2023-09-16 MED ORDER — METHYLPREDNISOLONE SODIUM SUCC 40 MG IJ SOLR: 40.0000 mg | Freq: Once | INTRAMUSCULAR | Status: DC

## 2023-09-16 MED ORDER — ROCURONIUM BROMIDE 100 MG/10ML IV SOLN
INTRAVENOUS | Status: DC | PRN
  Administered 2023-09-16: 30 mg via INTRAVENOUS
  Administered 2023-09-16: 20 mg via INTRAVENOUS

## 2023-09-16 MED ORDER — POTASSIUM PHOSPHATES 15 MMOLE/5ML IV SOLN
15.0000 mmol | Freq: Once | INTRAVENOUS | Status: AC
  Administered 2023-09-16: 15 mmol via INTRAVENOUS
  Filled 2023-09-16: qty 5

## 2023-09-16 MED ORDER — OXYCODONE HCL 5 MG/5ML PO SOLN: 5.0000 mg | Freq: Once | ORAL | Status: DC | PRN

## 2023-09-16 MED ORDER — SUCCINYLCHOLINE CHLORIDE 200 MG/10ML IV SOSY
PREFILLED_SYRINGE | INTRAVENOUS | Status: DC | PRN
  Administered 2023-09-16: 100 mg via INTRAVENOUS

## 2023-09-16 MED ORDER — LIDOCAINE HCL (PF) 2 % IJ SOLN
INTRAMUSCULAR | Status: AC
  Filled 2023-09-16: qty 5

## 2023-09-16 MED ORDER — DIPHENHYDRAMINE HCL 50 MG/ML IJ SOLN: 50.0000 mg | Freq: Once | INTRAMUSCULAR | Status: DC

## 2023-09-16 MED ORDER — ONDANSETRON HCL 4 MG/2ML IJ SOLN
INTRAMUSCULAR | Status: AC
Start: 2023-09-16 — End: ?
  Filled 2023-09-16: qty 2

## 2023-09-16 SURGICAL SUPPLY — 22 items
DERMABOND ADVANCED .7 DNX12 (GAUZE/BANDAGES/DRESSINGS) IMPLANT
DRAPE ARM DVNC X/XI (DISPOSABLE) IMPLANT
DRAPE COLUMN DVNC XI (DISPOSABLE) IMPLANT
DRAPE LEGGINS SURG 28X43 STRL (DRAPES) IMPLANT
DRAPE UNDER BUTTOCK W/FLU (DRAPES) IMPLANT
FORCEPS BPLR FENES DVNC XI (FORCEP) IMPLANT
FORCEPS CADIERE DVNC XI (FORCEP) IMPLANT
HOLDER FOLEY CATH W/STRAP (MISCELLANEOUS) IMPLANT
IRRIGATOR SUCT 8 DISP DVNC XI (IRRIGATION / IRRIGATOR) IMPLANT
KIT PINK PAD W/HEAD ARE REST (MISCELLANEOUS) ×2 IMPLANT
KIT PINK PAD W/HEAD ARM REST (MISCELLANEOUS) IMPLANT
NDL DRIVE SUT CUT DVNC (INSTRUMENTS) IMPLANT
NEEDLE DRIVE SUT CUT DVNC (INSTRUMENTS) ×2 IMPLANT
NS IRRIG 500ML POUR BTL (IV SOLUTION) IMPLANT
OBTURATOR OPTICAL STND 8 DVNC (TROCAR) ×2
OBTURATOR OPTICALSTD 8 DVNC (TROCAR) IMPLANT
SUT MNCRL 4-0 (SUTURE) ×4
SUT MNCRL 4-0 27XMFL (SUTURE) ×4
SUT VIC AB 3-0 SH 27 (SUTURE) ×2
SUT VIC AB 3-0 SH 27X BRD (SUTURE) IMPLANT
SUTURE MNCRL 4-0 27XMF (SUTURE) IMPLANT
TRAY FOLEY SLVR 16FR LF STAT (SET/KITS/TRAYS/PACK) IMPLANT

## 2023-09-16 NOTE — Progress Notes (Signed)
Patient ID: YSA EVOY, female   DOB: 06/18/1956, 67 y.o.   MRN: [id number]     SURGICAL PROGRESS NOTE   Hospital Day(s): 10.   Interval History: Patient seen and examined, no acute events or new complaints overnight. Patient reports not feeling comfortable today.  This is the first day that patient does not feel good in the last few days.  She endorsed that she felt some burning that radiated from the left side of the abdomen to the right side of the abdomen.  She did have some diarrhea yesterday.  She denies any nausea.  She feels that the diarrhea was caused by the feeling supplement.  Vital signs in last 24 hours: [min-max] current  Temp:  [97.8 F (36.6 C)-98.6 F (37 C)] 97.8 F (36.6 C) (11/16 0915) Pulse Rate:  [73-87] 73 (11/16 0915) Resp:  [16-20] 16 (11/16 0915) BP: (143-146)/(65-74) 143/74 (11/16 0915) SpO2:  [95 %-100 %] 98 % (11/16 0915)     Height: 5' 2.5" (158.8 cm) Weight: 67.7 kg BMI (Calculated): 26.85   Physical Exam:  Constitutional: alert, cooperative and no distress  Respiratory: breathing non-labored at rest  Cardiovascular: regular rate and sinus rhythm  Gastrointestinal: soft, non-tender, mild distended and mild tympanic.  No significant tenderness  Labs:     Latest Ref Rng & Units 09/16/2023    7:52 AM 09/14/2023    7:49 AM 09/12/2023    5:07 AM  CBC  WBC 4.0 - 10.5 K/uL 11.5  14.5  17.0   Hemoglobin 12.0 - 15.0 g/dL 40.9  81.1  91.4   Hematocrit 36.0 - 46.0 % 31.8  33.2  31.2   Platelets 150 - 400 K/uL 630  723  592       Latest Ref Rng & Units 09/16/2023    5:00 AM 09/15/2023    3:57 AM 09/14/2023    7:49 AM  CMP  Glucose 70 - 99 mg/dL 782  97  87   BUN 8 - 23 mg/dL 10  9  13    Creatinine 0.44 - 1.00 mg/dL 9.56  2.13  0.86   Sodium 135 - 145 mmol/L 135  134  133   Potassium 3.5 - 5.1 mmol/L 3.8  3.7  3.4   Chloride 98 - 111 mmol/L 105  102  98   CO2 22 - 32 mmol/L 23  24  24    Calcium 8.9 - 10.3 mg/dL 8.2  8.2  8.1   Total  Protein 6.5 - 8.1 g/dL  5.8    Total Bilirubin <1.2 mg/dL  0.8    Alkaline Phos 38 - 126 U/L  90    AST 15 - 41 U/L  18    ALT 0 - 44 U/L  23      Imaging studies: No new pertinent imaging studies   Assessment/Plan:  67 y.o. female with chronic diverticulitis with stricture and abscess 9 Day Post-Op s/p partial colectomy with colorectal anastomosis.   -Patient without fever, no tachycardia -Today white blood cell count continue decreasing trend.  Platelets also little bit better -Even though she had better labs and adequate vital signs, this morning patient endorses that she did not have a good night.  He is a person that she is to limit that she is feeling comfortable. -I was planning to do CT scan of the abdomen and pelvis with contrast but due to her allergies radiology is recommended to proceed without contrast since 1 I am looking for which she  is abscess versus leak versus obstruction should be able to be evaluated in a noncontrasted the -Continue TPN -Continue IV antibiotic therapy  Gae Gallop, MD

## 2023-09-16 NOTE — Op Note (Signed)
Preoperative diagnosis: Postoperative small bowel obstruction  Postoperative diagnosis: Postoperative small bowel obstruction  Procedure: Robotic assisted laparoscopic lysis of adhesions   Anesthesia: GETA   Surgeon: Carolan Shiver, MD   Wound Classification: Clean    Specimen: None   Complications: None   Estimated Blood Loss: 50 mL   Indications: Patient is a 67 y.o. female with symptoms of partial small bowel obstruction now resolving with conservative management.  Patient recent history of sigmoid colectomy.  Multiple CT scan concerning for early bowel obstruction.   FIndings: 1.  Abrupt tapering of caliber of the small bowel from distal jejunum with collapsed ileum 2.  Multiple areas of "kinks" in the pelvic area causing partial obstruction and low transit 3.  No sign of abscess or purulence identified   Description of procedure: The patient was placed on the operating table in the lithotomy position, both arms tucked. General anesthesia was induced. A time-out was completed verifying correct patient, procedure, site, positioning, and implant(s) and/or special equipment prior to beginning this procedure. The abdomen was prepped and draped in the usual sterile fashion.    The previous incision from the right upper quadrant was open and blunt trocar was inserted.  Gas insufflation was initiated until the abdominal pressure was measured at 15 mmHg.  No injury was identified.  Afterwards, for additional incision was made 5 cm apart along the left side of the abdominal wall.  No injuries from trocar placements were noted. Exparel infused on the bilateral abdominal wall. The table was placed in the Trendelenburg position.  Xi robotic platform was then brought to the operative field and docked at an angle from the left lower quadrant.     Examination of the abdominal cavity noted collapsed ileum with dilated jejunum.  A very difficult and time-consuming lysis of lesion was started  in the right lower quadrant identified the cecum.  Once the cecum was identified I proceeded to identify the terminal ileum.  The was collapsed.  Again abundant amount of adhesions were identified throughout the ileum to the chronic abscess cavity that was drained during the initial surgery.  No abscess or purulence was identified.  Slowly the ileum was freed from the adhesions to the mesentery cavity.  Once this was freed I was able to run the small intestine from the terminal ileum to the jejunum.  While I was inspecting the small intestine I was able to see the change in caliber from the ileum to the jejunum and this was all free to be able to mobilize now.  No other source of obstruction was identified and the procedure was terminated.  Abdominal cavity pneumoperitoneum was collapsed.  The trocars were removed. All skin incisions then closed with subcuticular sutures Monocryl 4-0.  Wounds then dressed with dermabond.   The patient tolerated the procedure well, awakened from anesthesia and was taken to the postanesthesia care unit in satisfactory condition.  NGT was left in place.  Foley was removed.  Sponge count and instrument count correct at the end of the procedure.

## 2023-09-16 NOTE — Plan of Care (Signed)

## 2023-09-16 NOTE — Transfer of Care (Signed)
Immediate Anesthesia Transfer of Care Note  Patient: Mariah Cooper  Procedure(s) Performed: XI ROBOTIC ASSISTED DIAGNOSTIC LAPAROSCOPY XI ROBOTIC ASSISTED LAPAROSCOPIC LYSIS OF ADHESION  Patient Location: PACU  Anesthesia Type:General  Level of Consciousness: awake, alert , and oriented  Airway & Oxygen Therapy: Patient Spontanous Breathing and Patient connected to face mask oxygen  Post-op Assessment: Report given to RN and Post -op Vital signs reviewed and stable  Post vital signs: Reviewed and stable  Last Vitals:  Vitals Value Taken Time  BP 162/59 09/16/23 1751  Temp 36.2 C 09/16/23 1751  Pulse 89 09/16/23 1756  Resp 19 09/16/23 1756  SpO2 100 % 09/16/23 1756  Vitals shown include unfiled device data.  Last Pain:  Vitals:   09/16/23 1751  TempSrc:   PainSc: 0-No pain      Patients Stated Pain Goal: 0 (09/16/23 0846)  Complications: No notable events documented.

## 2023-09-16 NOTE — Anesthesia Preprocedure Evaluation (Signed)
Anesthesia Evaluation  Patient identified by MRN, date of birth, ID band Patient awake    Reviewed: Allergy & Precautions, NPO status , Patient's Chart, lab work & pertinent test results  History of Anesthesia Complications (+) PROLONGED EMERGENCE and history of anesthetic complications  Airway Mallampati: III  TM Distance: <3 FB Neck ROM: full    Dental  (+) Chipped   Pulmonary neg shortness of breath, COPD, former smoker   Pulmonary exam normal        Cardiovascular Exercise Tolerance: Good (-) angina (-) Past MI negative cardio ROS Normal cardiovascular exam     Neuro/Psych negative neurological ROS  negative psych ROS   GI/Hepatic Neg liver ROS, hiatal hernia,GERD  Controlled,,  Endo/Other  negative endocrine ROS    Renal/GU      Musculoskeletal   Abdominal   Peds  Hematology negative hematology ROS (+)   Anesthesia Other Findings Past Medical History: No date: Anemia No date: Arthritis No date: Complication of anesthesia     Comment:  HARD TIME WAKING UP-PT DOES NOT WANT GENERAL ANESTHESIA No date: GERD (gastroesophageal reflux disease)     Comment:  WILL OCCASSIONALLY TAKE MAALOX No date: History of hiatal hernia 08/2023: Stricture of sigmoid colon (HCC) 2022: Thoracic aortic atherosclerosis (HCC)  Past Surgical History: 01/15/2018: BREAST CYST ASPIRATION; Right     Comment:  Neg No date: CHOLECYSTECTOMY 07/14/2023: COLONOSCOPY; N/A     Comment:  Procedure: COLONOSCOPY;  Surgeon: [name],              DO;  Location: [facility] ENDOSCOPY;  Service:               Gastroenterology;  Laterality: N/A; No date: ECTOPIC PREGNANCY SURGERY No date: INCONTINENCE SURGERY     Comment:  mesh sling- 2013 No date: LAPAROSCOPIC HYSTERECTOMY 08/31/2015: OPEN REDUCTION INTERNAL FIXATION (ORIF) DISTAL RADIAL  FRACTURE; Left     Comment:  Procedure: OPEN REDUCTION INTERNAL FIXATION (ORIF)                DISTAL RADIAL FRACTURE;  Surgeon: Deeann Saint, MD;                Location: [facility] ORS;  Service: Orthopedics;  Laterality:               Left; 07/14/2023: POLYPECTOMY     Comment:  Procedure: POLYPECTOMY;  Surgeon: [name],              DO;  Location: [facility] ENDOSCOPY;  Service:               Gastroenterology;; 07/14/2023: SUBMUCOSAL TATTOO INJECTION     Comment:  Procedure: SUBMUCOSAL TATTOO INJECTION;  Surgeon: [name], DO;  Location: [facility] ENDOSCOPY;  Service:               Gastroenterology;;  at 25cm in colon No date: VAGINAL PROLAPSE REPAIR     Comment:  "bladder tack" after hysterectomy  BMI    Body Mass Index: 26.86 kg/m      Reproductive/Obstetrics negative OB ROS                             Anesthesia Physical Anesthesia Plan  ASA: 3  Anesthesia Plan: General ETT   Post-op Pain Management:    Induction: Intravenous  PONV Risk Score and Plan: Ondansetron, Dexamethasone, Midazolam and  Treatment may vary due to age or medical condition  Airway Management Planned: Oral ETT  Additional Equipment:   Intra-op Plan:   Post-operative Plan: Extubation in OR  Informed Consent: I have reviewed the patients History and Physical, chart, labs and discussed the procedure including the risks, benefits and alternatives for the proposed anesthesia with the patient or authorized representative who has indicated his/her understanding and acceptance.     Dental Advisory Given  Plan Discussed with: Anesthesiologist, CRNA and Surgeon  Anesthesia Plan Comments: (Patient consented for risks of anesthesia including but not limited to:  - adverse reactions to medications - damage to eyes, teeth, lips or other oral mucosa - nerve damage due to positioning  - sore throat or hoarseness - Damage to heart, brain, nerves, lungs, other parts of body or loss of life  Patient voiced understanding and assent.)        Anesthesia Quick Evaluation

## 2023-09-16 NOTE — Anesthesia Procedure Notes (Signed)
Procedure Name: Intubation Date/Time: 09/16/2023 3:12 PM  Performed by: Irving Burton, CRNAPre-anesthesia Checklist: Patient identified, Patient being monitored, Timeout performed, Emergency Drugs available and Suction available Patient Re-evaluated:Patient Re-evaluated prior to induction Oxygen Delivery Method: Circle system utilized Preoxygenation: Pre-oxygenation with 100% oxygen Induction Type: IV induction and Rapid sequence Laryngoscope Size: 3 and McGrath Grade View: Grade I Tube type: Oral Tube size: 6.5 mm Number of attempts: 1 Airway Equipment and Method: Stylet and Video-laryngoscopy Placement Confirmation: ETT inserted through vocal cords under direct vision, positive ETCO2 and breath sounds checked- equal and bilateral Secured at: 21 cm Tube secured with: Tape Dental Injury: Teeth and Oropharynx as per pre-operative assessment

## 2023-09-16 NOTE — Consult Note (Addendum)
PHARMACY - TOTAL PARENTERAL NUTRITION CONSULT NOTE   Indication: Prolonged ileus  Patient Measurements: Height: 5' 2.5" (158.8 cm) Weight: 67.7 kg (149 lb 4 oz) IBW/kg (Calculated) : 51.25 TPN AdjBW (KG): 64 Body mass index is 26.86 kg/m. Usual Weight: ~69 kg  Assessment:  67 y.o. female with chronic diverticulitis with stricture and abscess  s/p partial colectomy with colorectal anastomosis done on 09/06/23. Per surgeon, patient has been able to pass gas and had a bowel movement, but unable to advance diet. Stated that although she is very hungry, she's unable to keep anything down. Pharmacy has been consulted for TPN assistance until patient's diet is able to be advanced  Glucose / Insulin: BG 143-170 sSSI  q6h:  5 units per last 24 hrs Electrolytes: Phos 2.2 ordered Potassium Phosphate 15 mmol IV x1 Renal: Scr<1, stable Hepatic: AST/ALT wnl Intake / Output; MIVF:  GI Imaging: 10/18 CT abdomen/pelvis:  1. Severe sigmoid colon diverticulosis with long segment of abnormal wall thickening corresponding to the area of diverticular stricture identified on recent barium enema from 07/31/2023. Imaging findings are compatible with severe chronic diverticular disease. No significant surrounding inflammatory changes or free fluid noted at this time. 11/09 repeat CT abdomen/pelvis:  1. Postop change from sigmoid resection and colorectal anastomosis. No specific findings identified to suggest anastomotic dehiscence. 2. Small fluid collection along the right iliac fossa measures 2.8 by 1.1 by 3.1 cm (volume = 5 cc). This may represent a postoperative seroma or hematoma. Abscess is considered less likely.  GI Surgeries / Procedures:    Central access: PICC TPN start date: 09/15/2023  Nutritional Goals: Due to current fluid shortage, goal TPN rate has been modified to 42 mL/hr (provides 80 g of protein and 1169 kcals per day)  Will continue to monitor fluid crisis and adjust goals as  appropriate.  RD Assessment: Estimated Needs Total Energy Estimated Needs: 1600-1800kcal/day Total Protein Estimated Needs: 80-90g/day Total Fluid Estimated Needs: 1.6-1.8L/day  Current Nutrition:  Full liquids  Plan:  Continue TPN at 42 mL/hr at 1800 and 20% SMOFlipid x 1 today (Saturday 09/16/23) -(Plan for 20% fat emulsion(SMOFLIPID) infusion to be ordered 5 days a week(likely Monday-Friday with weekend holiday) Electrolytes in TPN: Na 48mEq/L, K 28mEq/L, Ca 1mEq/L, Mg 50mEq/L, and Phos 64mmol/L. Cl:Ac 1:1 Add standard MVI and trace elements to TPN Phos 2.2 -ordered Potassium Phosphate 15 mmol IV x1 Thiamine 100mg  IV x 5 days ordered (outside of TPN) Continue Sensitive q6h SSI and adjust as needed  Monitor TPN labs until stable, then biweekly on Mon/Thurs  Bari Mantis PharmD Clinical Pharmacist 09/16/2023

## 2023-09-16 NOTE — Progress Notes (Signed)
Patient ID: Mariah Cooper, female   DOB: Dec 06, 1955, 67 y.o.   MRN: [id number]  Patient reevaluated after CT scan of the abdomen and pelvis.  Patient continue with distended abdomen.  No nausea or vomiting.  New CT scan with persistent proximal small bowel dilation with transition point in the left abdomen.  No sign of peritonitis.  I discussed with patient the results.  At this point and offered the patient to continue conservative management with watchful waiting versus proceeding with diagnostic laparoscopy.  Patient is tired of not progressing and she agreed to proceed with diagnostic laparoscopy.  Discussed with patient the goal of the surgery that is to find why she is not progressing adequately.  She understand that there is a risk of bowel injury, intra-abdominal infection, bleeding, possible need of colostomy if there is any sign of leak, among others.  The patient reports she understood and agreed to proceed with diagnostic laparoscopy.  Carolan Shiver, MD, FACS

## 2023-09-16 NOTE — Consult Note (Signed)
Pharmacy Antibiotic Note  Mariah Cooper is a 67 y.o. female admitted on 09/06/2023 with  stricture of the sigmoid colon due to chronic diverticulitis . Patient had partial colectomy with partial colorectal anastomosis 09/06/2023. WBC 26.6, plts 527, concern for intra-abdominal infection. Pharmacy has been consulted for Zosyn dosing.  -TPN started 09/15/23 -WBC improving, afebrile   Plan:  Day 7 - Zosyn 3.375g IV q8h (4 hour infusion).    Height: 5' 2.5" (158.8 cm) Weight: 67.7 kg (149 lb 4 oz) IBW/kg (Calculated) : 51.25  Temp (24hrs), Avg:98.2 F (36.8 C), Min:97.8 F (36.6 C), Max:98.6 F (37 C)  Recent Labs  Lab 09/10/23 0737 09/11/23 0721 09/12/23 0507 09/14/23 0749 09/15/23 0357 09/16/23 0500 09/16/23 0752  WBC 17.2* 15.4* 17.0* 14.5*  --   --  11.5*  CREATININE 0.60 0.62 0.69 0.64 0.56 0.53  --     Estimated Creatinine Clearance: 60.7 mL/min (by C-G formula based on SCr of 0.53 mg/dL).    Allergies  Allergen Reactions   Sulfa Antibiotics Itching   Ivp Dye [Iodinated Contrast Media] Rash   Tape Rash    OK TO USE PAPER TAPE    Antimicrobials this admission: 11/9 Zosyn  >>   Dose adjustments this admission:   Microbiology results:   Thank you for allowing pharmacy to be a part of this patient's care.  Bari Mantis PharmD Clinical Pharmacist 09/16/2023

## 2023-09-16 NOTE — Progress Notes (Signed)
Surgeon has been notified: PT reports that she has  been feeling a warm sensation in her abdomen chest area that goes from left shoulder area to abdomen over the night. She was wondering if it could be the TPN.

## 2023-09-17 ENCOUNTER — Inpatient Hospital Stay: Payer: Medicare Other

## 2023-09-17 LAB — GLUCOSE, CAPILLARY
Glucose-Capillary: 103 mg/dL — ABNORMAL HIGH (ref 70–99)
Glucose-Capillary: 105 mg/dL — ABNORMAL HIGH (ref 70–99)
Glucose-Capillary: 116 mg/dL — ABNORMAL HIGH (ref 70–99)
Glucose-Capillary: 119 mg/dL — ABNORMAL HIGH (ref 70–99)
Glucose-Capillary: 121 mg/dL — ABNORMAL HIGH (ref 70–99)
Glucose-Capillary: 170 mg/dL — ABNORMAL HIGH (ref 70–99)

## 2023-09-17 LAB — BASIC METABOLIC PANEL
Anion gap: 6 (ref 5–15)
BUN: 18 mg/dL (ref 8–23)
CO2: 21 mmol/L — ABNORMAL LOW (ref 22–32)
Calcium: 8.1 mg/dL — ABNORMAL LOW (ref 8.9–10.3)
Chloride: 106 mmol/L (ref 98–111)
Creatinine, Ser: 0.62 mg/dL (ref 0.44–1.00)
GFR, Estimated: >60 mL/min (ref >60)
Glucose, Bld: 115 mg/dL — ABNORMAL HIGH (ref 70–99)
Potassium: 3.8 mmol/L (ref 3.5–5.1)
Sodium: 133 mmol/L — ABNORMAL LOW (ref 135–145)

## 2023-09-17 LAB — CBC
HCT: 32.2 % — ABNORMAL LOW (ref 36.0–46.0)
Hemoglobin: 10.7 g/dL — ABNORMAL LOW (ref 12.0–15.0)
MCH: 30.9 pg (ref 26.0–34.0)
MCHC: 33.2 g/dL (ref 30.0–36.0)
MCV: 93.1 fL (ref 80.0–100.0)
Platelets: 653 10*3/uL — ABNORMAL HIGH (ref 150–400)
RBC: 3.46 MIL/uL — ABNORMAL LOW (ref 3.87–5.11)
RDW: 12.8 % (ref 11.5–15.5)
WBC: 21.1 10*3/uL — ABNORMAL HIGH (ref 4.0–10.5)
nRBC: 0 % (ref 0.0–0.2)

## 2023-09-17 LAB — MAGNESIUM: Magnesium: 2.3 mg/dL (ref 1.7–2.4)

## 2023-09-17 LAB — PHOSPHORUS: Phosphorus: 2.2 mg/dL — ABNORMAL LOW (ref 2.5–4.6)

## 2023-09-17 MED ORDER — PHENOL 1.4 % MT LIQD
1.0000 | OROMUCOSAL | Status: DC | PRN
  Filled 2023-09-17: qty 177

## 2023-09-17 MED ORDER — POTASSIUM PHOSPHATES 15 MMOLE/5ML IV SOLN
15.0000 mmol | Freq: Once | INTRAVENOUS | Status: AC
  Administered 2023-09-17: 15 mmol via INTRAVENOUS
  Filled 2023-09-17: qty 5

## 2023-09-17 MED ORDER — GABAPENTIN 250 MG/5ML PO SOLN
300.0000 mg | Freq: Two times a day (BID) | ORAL | Status: DC
  Administered 2023-09-17 – 2023-09-24 (×15): 300 mg
  Filled 2023-09-17 (×17): qty 6

## 2023-09-17 MED ORDER — TRACE MINERALS CU-MN-SE-ZN 300-55-60-3000 MCG/ML IV SOLN
INTRAVENOUS | Status: AC
  Filled 2023-09-17: qty 1000

## 2023-09-17 NOTE — Plan of Care (Signed)

## 2023-09-17 NOTE — Plan of Care (Signed)
Patient pain controlled with morphine. Denies n/v, denies passing gas.

## 2023-09-17 NOTE — Anesthesia Postprocedure Evaluation (Signed)
Anesthesia Post Note  Patient: Mariah Cooper  Procedure(s) Performed: XI ROBOTIC ASSISTED DIAGNOSTIC LAPAROSCOPY XI ROBOTIC ASSISTED LAPAROSCOPIC LYSIS OF ADHESION  Patient location during evaluation: PACU Anesthesia Type: General Level of consciousness: awake and alert Pain management: pain level controlled Vital Signs Assessment: post-procedure vital signs reviewed and stable Respiratory status: spontaneous breathing, nonlabored ventilation, respiratory function stable and patient connected to nasal cannula oxygen Cardiovascular status: blood pressure returned to baseline and stable Postop Assessment: no apparent nausea or vomiting Anesthetic complications: no   No notable events documented.   Last Vitals:  Vitals:   09/16/23 2000 09/17/23 0315  BP: (!) 150/78 (!) 149/71  Pulse: 93 88  Resp: 16 20  Temp: 37 C 36.6 C  SpO2: 100% 100%    Last Pain:  Vitals:   09/17/23 0157  TempSrc:   PainSc: Asleep                 Cleda Mccreedy Perian Tedder

## 2023-09-17 NOTE — Progress Notes (Signed)
Patient ID: Mariah Cooper, female   DOB: 1955-12-27, 66 y.o.   MRN: [id number]     SURGICAL PROGRESS NOTE   Hospital Day(s): 11.   Interval History: Patient seen and examined, no acute events or new complaints overnight. Patient reports feeling okay this morning.  She denies any significant complaints today.  Obviously she is having sore throat.  No worsening abdominal pain.  No passing gas.  Vital signs in last 24 hours: [min-max] current  Temp:  [97.1 F (36.2 C)-98.6 F (37 C)] 97.9 F (36.6 C) (11/17 0315) Pulse Rate:  [73-95] 88 (11/17 0315) Resp:  [14-20] 20 (11/17 0315) BP: (143-170)/(59-78) 149/71 (11/17 0315) SpO2:  [94 %-100 %] 100 % (11/17 0315)     Height: 5' 2.5" (158.8 cm) Weight: 67.7 kg BMI (Calculated): 26.85   Physical Exam:  Constitutional: alert, cooperative and no distress  Respiratory: breathing non-labored at rest  Cardiovascular: regular rate and sinus rhythm  Gastrointestinal: soft, non-tender, and non-distended  Labs:     Latest Ref Rng & Units 09/17/2023    3:36 AM 09/16/2023    7:52 AM 09/14/2023    7:49 AM  CBC  WBC 4.0 - 10.5 K/uL 21.1  11.5  14.5   Hemoglobin 12.0 - 15.0 g/dL 78.2  95.6  21.3   Hematocrit 36.0 - 46.0 % 32.2  31.8  33.2   Platelets 150 - 400 K/uL 653  630  723       Latest Ref Rng & Units 09/16/2023    5:00 AM 09/15/2023    3:57 AM 09/14/2023    7:49 AM  CMP  Glucose 70 - 99 mg/dL 086  97  87   BUN 8 - 23 mg/dL 10  9  13    Creatinine 0.44 - 1.00 mg/dL 5.78  4.69  6.29   Sodium 135 - 145 mmol/L 135  134  133   Potassium 3.5 - 5.1 mmol/L 3.8  3.7  3.4   Chloride 98 - 111 mmol/L 105  102  98   CO2 22 - 32 mmol/L 23  24  24    Calcium 8.9 - 10.3 mg/dL 8.2  8.2  8.1   Total Protein 6.5 - 8.1 g/dL  5.8    Total Bilirubin <1.2 mg/dL  0.8    Alkaline Phos 38 - 126 U/L  90    AST 15 - 41 U/L  18    ALT 0 - 44 U/L  23      Imaging studies: Abdominal x-ray shows small bowel dilation with gas consistent with ileus.   This x-ray today was done today as a baseline.   Assessment/Plan:  67 y.o. female with chronic diverticulitis with stricture and abscess 10 Day Post-Op s/p partial colectomy with colorectal anastomosis.  Complicated by bowel obstruction s/p robotic assisted laparoscopic lysis of adhesion postop day #1   --Patient today with stable vital signs -- Patient will continue with NGT in place with expected postoperative ileus -Encourage patient to ambulate -Will continue pain management -Continue IV antibiotic therapy -Continue TPN  Gae Gallop, MD

## 2023-09-17 NOTE — Consult Note (Signed)
PHARMACY - TOTAL PARENTERAL NUTRITION CONSULT NOTE   Indication: Prolonged ileus  Patient Measurements: Height: 5' 2.5" (158.8 cm) Weight: 67.7 kg (149 lb 4 oz) IBW/kg (Calculated) : 51.25 TPN AdjBW (KG): 64 Body mass index is 26.86 kg/m. Usual Weight: ~69 kg  Assessment:  67 y.o. female with chronic diverticulitis with stricture and abscess  s/p partial colectomy with colorectal anastomosis done on 09/06/23. Per surgeon, patient has been able to pass gas and had a bowel movement, but unable to advance diet. Stated that although she is very hungry, she's unable to keep anything down. Pharmacy has been consulted for TPN assistance until patient's diet is able to be advanced  Glucose / Insulin: BG 110-182 sSSI  q6h:  2 units per last 24 hrs   *dexamethasone 10mg  IV given 11/16 @1539  Electrolytes: 11/17 Phos 2.2 ordered Potassium Phosphate 15 mmol IV x1 Renal: Scr<1, stable Hepatic: AST/ALT wnl Intake / Output; MIVF:  GI Imaging: 10/18 CT abdomen/pelvis:  1. Severe sigmoid colon diverticulosis with long segment of abnormal wall thickening corresponding to the area of diverticular stricture identified on recent barium enema from 07/31/2023. Imaging findings are compatible with severe chronic diverticular disease. No significant surrounding inflammatory changes or free fluid noted at this time. 11/09 repeat CT abdomen/pelvis:  1. Postop change from sigmoid resection and colorectal anastomosis. No specific findings identified to suggest anastomotic dehiscence. 2. Small fluid collection along the right iliac fossa measures 2.8 by 1.1 by 3.1 cm (volume = 5 cc). This may represent a postoperative seroma or hematoma. Abscess is considered less likely.  GI Surgeries / Procedures:  11/06: partial colectomy with colorectal anastomosis (d/t stricture and abscess) 11/16: s/p robotic assisted laparoscopic lysis of adhesion postop day #1  -on Zosyn  Central access: PICC TPN start date:  09/15/2023  Nutritional Goals: Due to current fluid shortage, goal TPN rate has been modified to 42 mL/hr (provides 80 g of protein and 1169 kcals per day)  Will continue to monitor fluid crisis and adjust goals as appropriate.  RD Assessment: Estimated Needs Total Energy Estimated Needs: 1600-1800kcal/day Total Protein Estimated Needs: 80-90g/day Total Fluid Estimated Needs: 1.6-1.8L/day  Current Nutrition:  2nd surgery 11/16 so back to NPO  Plan:  Continue TPN at 42 mL/hr at 1800   -(Plan for 20% fat emulsion(SMOFLIPID) infusion to be ordered 5 days a week(likely Monday-Friday with weekend holiday) Electrolytes in TPN: Na 35mEq/L, K 25mEq/L, Ca 59mEq/L, Mg 31mEq/L, and Phos 13mmol/L. Cl:Ac 1:1 Add standard MVI and trace elements to TPN Phos 2.2 -ordered Potassium Phosphate 15 mmol IV x1 again today 09/17/23 Thiamine 100mg  IV x 5 days ordered (outside of TPN) Continue Sensitive q6h SSI and adjust as needed  Monitor TPN labs until stable, then biweekly on Mon/Thurs  Bari Mantis PharmD Clinical Pharmacist 09/17/2023

## 2023-09-18 ENCOUNTER — Encounter: Payer: Self-pay | Admitting: General Surgery

## 2023-09-18 LAB — COMPREHENSIVE METABOLIC PANEL
ALT: 14 U/L (ref 0–44)
AST: 15 U/L (ref 15–41)
Albumin: 2.7 g/dL — ABNORMAL LOW (ref 3.5–5.0)
Alkaline Phosphatase: 62 U/L (ref 38–126)
Anion gap: 5 (ref 5–15)
BUN: 17 mg/dL (ref 8–23)
CO2: 25 mmol/L (ref 22–32)
Calcium: 7.9 mg/dL — ABNORMAL LOW (ref 8.9–10.3)
Chloride: 102 mmol/L (ref 98–111)
Creatinine, Ser: 0.57 mg/dL (ref 0.44–1.00)
GFR, Estimated: >60 mL/min (ref >60)
Glucose, Bld: 109 mg/dL — ABNORMAL HIGH (ref 70–99)
Potassium: 3.8 mmol/L (ref 3.5–5.1)
Sodium: 132 mmol/L — ABNORMAL LOW (ref 135–145)
Total Bilirubin: 0.4 mg/dL (ref <1.2)
Total Protein: 6.2 g/dL — ABNORMAL LOW (ref 6.5–8.1)

## 2023-09-18 LAB — GLUCOSE, CAPILLARY
Glucose-Capillary: 104 mg/dL — ABNORMAL HIGH (ref 70–99)
Glucose-Capillary: 141 mg/dL — ABNORMAL HIGH (ref 70–99)
Glucose-Capillary: 102 mg/dL — ABNORMAL HIGH (ref 70–99)
Glucose-Capillary: 117 mg/dL — ABNORMAL HIGH (ref 70–99)

## 2023-09-18 LAB — TRIGLYCERIDES: Triglycerides: 225 mg/dL — ABNORMAL HIGH (ref <150)

## 2023-09-18 LAB — PHOSPHORUS: Phosphorus: 1.9 mg/dL — ABNORMAL LOW (ref 2.5–4.6)

## 2023-09-18 LAB — MAGNESIUM: Magnesium: 2.4 mg/dL (ref 1.7–2.4)

## 2023-09-18 MED ORDER — TRACE MINERALS CU-MN-SE-ZN 300-55-60-3000 MCG/ML IV SOLN
INTRAVENOUS | Status: AC
  Filled 2023-09-18: qty 1000

## 2023-09-18 MED ORDER — POTASSIUM PHOSPHATES 15 MMOLE/5ML IV SOLN
30.0000 mmol | Freq: Once | INTRAVENOUS | Status: AC
  Administered 2023-09-18: 30 mmol via INTRAVENOUS
  Filled 2023-09-18: qty 10

## 2023-09-18 MED ORDER — FAT EMUL FISH OIL/PLANT BASED 20% (SMOFLIPID)IV EMUL
250.0000 mL | INTRAVENOUS | Status: AC
  Administered 2023-09-18: 250 mL via INTRAVENOUS
  Filled 2023-09-18: qty 250

## 2023-09-18 NOTE — Plan of Care (Signed)

## 2023-09-18 NOTE — Consult Note (Signed)
PHARMACY - TOTAL PARENTERAL NUTRITION CONSULT NOTE   Indication: Prolonged ileus  Patient Measurements: Height: 5' 2.5" (158.8 cm) Weight: 66.4 kg (146 lb 6.2 oz) IBW/kg (Calculated) : 51.25 TPN AdjBW (KG): 64 Body mass index is 26.35 kg/m. Usual Weight: ~69 kg  Assessment:  67 y.o. female with chronic diverticulitis with stricture and abscess  s/p partial colectomy with colorectal anastomosis done on 09/06/23. Per surgeon, patient has been able to pass gas and had a bowel movement, but unable to advance diet. Stated that although she is very hungry, she's unable to keep anything down. Pharmacy has been consulted for TPN assistance until patient's diet is able to be advanced  Glucose / Insulin: BG 104-109 sSSI  q6h:  no insulin requirement in last 24 hrs   *dexamethasone 10mg  IV given 11/16 @1539  Electrolytes: 11/18 Phos 1.9 ordered Potassium Phosphate 30 mmol IV x1 Renal: Scr<1, stable Hepatic: AST/ALT wnl Intake / Output; MIVF:  GI Imaging: 10/18 CT abdomen/pelvis:  1. Severe sigmoid colon diverticulosis with long segment of abnormal wall thickening corresponding to the area of diverticular stricture identified on recent barium enema from 07/31/2023. Imaging findings are compatible with severe chronic diverticular disease. No significant surrounding inflammatory changes or free fluid noted at this time. 11/09 repeat CT abdomen/pelvis:  1. Postop change from sigmoid resection and colorectal anastomosis. No specific findings identified to suggest anastomotic dehiscence. 2. Small fluid collection along the right iliac fossa measures 2.8 by 1.1 by 3.1 cm (volume = 5 cc). This may represent a postoperative seroma or hematoma. Abscess is considered less likely.  GI Surgeries / Procedures:  11/06: partial colectomy with colorectal anastomosis (d/t stricture and abscess) 11/16: s/p robotic assisted laparoscopic lysis of adhesion postop day #1  -on Zosyn  Central access: PICC TPN  start date: 09/15/2023  Nutritional Goals: Due to current fluid shortage, goal TPN rate has been modified to 42 mL/hr (provides 80 g of protein and 1169 kcals per day)  Will continue to monitor fluid crisis and adjust goals as appropriate.  RD Assessment: Estimated Needs Total Energy Estimated Needs: 1600-1800kcal/day Total Protein Estimated Needs: 80-90g/day Total Fluid Estimated Needs: 1.6-1.8L/day  Current Nutrition:  2nd surgery 11/16 so back to NPO  Plan:  Continue TPN at 42 mL/hr at 1800   -Plan for 20% fat emulsion(SMOFLIPID) infusion to be ordered 5 days a week(Monday-Friday with weekend holiday) Electrolytes in TPN: Na 16mEq/L, K 42mEq/L, Ca 5mEq/L, Mg 61mEq/L, and Phos 42mmol/L. Cl:Ac 1:1 Add standard MVI and trace elements to TPN Phos 1.9 -ordered Potassium Phosphate 30 mmol x 1 Thiamine 100mg  IV x 5 days ordered (outside of TPN) Continue Sensitive q6h SSI and adjust as needed  Monitor TPN labs until stable, then biweekly on Mon/Thurs  Bettey Costa, PharmD Clinical Pharmacist 09/18/2023 9:49 AM

## 2023-09-18 NOTE — Progress Notes (Signed)
Patient ID: Mariah Cooper, female   DOB: 1956/09/09, 67 y.o.   MRN: [id number]     SURGICAL PROGRESS NOTE   Hospital Day(s): 12.   Interval History: Patient seen and examined, no acute events or new complaints overnight. Patient reports feeling okay this morning.  She is still not passing gas or having any bowel movements.  Patient denies any nausea or vomiting.  No significant abdominal pain.  Vital signs in last 24 hours: [min-max] current  Temp:  [98.4 F (36.9 C)] 98.4 F (36.9 C) (11/18 0202) Pulse Rate:  [80-87] 87 (11/18 0737) Resp:  [16-18] 18 (11/18 0737) BP: (131-132)/(67-73) 131/73 (11/18 0737) SpO2:  [95 %-97 %] 95 % (11/18 0737) Weight:  [66.4 kg] 66.4 kg (11/18 0220)     Height: 5' 2.5" (158.8 cm) Weight: 66.4 kg BMI (Calculated): 26.33   Physical Exam:  Constitutional: alert, cooperative and no distress  Respiratory: breathing non-labored at rest  Cardiovascular: regular rate and sinus rhythm  Gastrointestinal: soft, non-tender, but distended  Labs:     Latest Ref Rng & Units 09/17/2023    3:36 AM 09/16/2023    7:52 AM 09/14/2023    7:49 AM  CBC  WBC 4.0 - 10.5 K/uL 21.1  11.5  14.5   Hemoglobin 12.0 - 15.0 g/dL 65.7  84.6  96.2   Hematocrit 36.0 - 46.0 % 32.2  31.8  33.2   Platelets 150 - 400 K/uL 653  630  723       Latest Ref Rng & Units 09/18/2023    3:39 AM 09/17/2023    8:38 AM 09/16/2023    5:00 AM  CMP  Glucose 70 - 99 mg/dL 952  841  324   BUN 8 - 23 mg/dL 17  18  10    Creatinine 0.44 - 1.00 mg/dL 4.01  0.27  2.53   Sodium 135 - 145 mmol/L 132  133  135   Potassium 3.5 - 5.1 mmol/L 3.8  3.8  3.8   Chloride 98 - 111 mmol/L 102  106  105   CO2 22 - 32 mmol/L 25  21  23    Calcium 8.9 - 10.3 mg/dL 7.9  8.1  8.2   Total Protein 6.5 - 8.1 g/dL 6.2     Total Bilirubin <1.2 mg/dL 0.4     Alkaline Phos 38 - 126 U/L 62     AST 15 - 41 U/L 15     ALT 0 - 44 U/L 14       Imaging studies: No new pertinent imaging studies   Assessment/Plan:   67 y.o. female with chronic diverticulitis with stricture and abscess 10 Day Post-Op s/p partial colectomy with colorectal anastomosis.  Complicated by bowel obstruction s/p robotic assisted laparoscopic lysis of adhesion postop day #2.  -Patient without any significant clinical change -Continue with distended abdomen, tympanic, no abdominal pain, no peritoneal signs -Will continue with NGT to low intermittent suction -Will continue current therapies -Encourage patient to ambulate -Continue TPN  Gae Gallop, MD

## 2023-09-19 LAB — BASIC METABOLIC PANEL
Anion gap: 5 (ref 5–15)
BUN: 15 mg/dL (ref 8–23)
CO2: 24 mmol/L (ref 22–32)
Calcium: 8.5 mg/dL — ABNORMAL LOW (ref 8.9–10.3)
Chloride: 103 mmol/L (ref 98–111)
Creatinine, Ser: 0.51 mg/dL (ref 0.44–1.00)
GFR, Estimated: >60 mL/min (ref >60)
Glucose, Bld: 119 mg/dL — ABNORMAL HIGH (ref 70–99)
Potassium: 3.9 mmol/L (ref 3.5–5.1)
Sodium: 132 mmol/L — ABNORMAL LOW (ref 135–145)

## 2023-09-19 LAB — PHOSPHORUS: Phosphorus: 2.7 mg/dL (ref 2.5–4.6)

## 2023-09-19 LAB — GLUCOSE, CAPILLARY
Glucose-Capillary: 105 mg/dL — ABNORMAL HIGH (ref 70–99)
Glucose-Capillary: 105 mg/dL — ABNORMAL HIGH (ref 70–99)
Glucose-Capillary: 117 mg/dL — ABNORMAL HIGH (ref 70–99)
Glucose-Capillary: 121 mg/dL — ABNORMAL HIGH (ref 70–99)

## 2023-09-19 LAB — MAGNESIUM: Magnesium: 2.4 mg/dL (ref 1.7–2.4)

## 2023-09-19 LAB — TRIGLYCERIDES: Triglycerides: 223 mg/dL — ABNORMAL HIGH (ref <150)

## 2023-09-19 MED ORDER — FAT EMUL FISH OIL/PLANT BASED 20% (SMOFLIPID)IV EMUL
250.0000 mL | INTRAVENOUS | Status: AC
  Administered 2023-09-19: 250 mL via INTRAVENOUS
  Filled 2023-09-19: qty 250

## 2023-09-19 MED ORDER — TRACE MINERALS CU-MN-SE-ZN 300-55-60-3000 MCG/ML IV SOLN
INTRAVENOUS | Status: AC
  Filled 2023-09-19: qty 1000

## 2023-09-19 NOTE — Progress Notes (Signed)
Patient ID: Mariah Cooper, female   DOB: 05-20-1956, 67 y.o.   MRN: [id number]     SURGICAL PROGRESS NOTE   Hospital Day(s): 13.   Interval History: Patient seen and examined, no acute events or new complaints overnight. Patient reports the same.  Patient denies any worsening aminal pain.  She is still not passing gas.  Patient was able to ambulate yesterday.  Vital signs in last 24 hours: [min-max] current  Temp:  [97.6 F (36.4 C)-99.1 F (37.3 C)] 98 F (36.7 C) (11/19 0827) Pulse Rate:  [81-92] 81 (11/19 0827) Resp:  [18] 18 (11/19 0827) BP: (133-142)/(68-78) 142/73 (11/19 0827) SpO2:  [96 %-100 %] 96 % (11/19 0827) Weight:  [64.9 kg] 64.9 kg (11/19 0336)     Height: 5' 2.5" (158.8 cm) Weight: 64.9 kg BMI (Calculated): 25.74   Physical Exam:  Constitutional: alert, cooperative and no distress  Respiratory: breathing non-labored at rest  Cardiovascular: regular rate and sinus rhythm  Gastrointestinal: soft, non-tender, but distended  Labs:     Latest Ref Rng & Units 09/17/2023    3:36 AM 09/16/2023    7:52 AM 09/14/2023    7:49 AM  CBC  WBC 4.0 - 10.5 K/uL 21.1  11.5  14.5   Hemoglobin 12.0 - 15.0 g/dL 37.1  06.2  69.4   Hematocrit 36.0 - 46.0 % 32.2  31.8  33.2   Platelets 150 - 400 K/uL 653  630  723       Latest Ref Rng & Units 09/19/2023    5:32 AM 09/18/2023    3:39 AM 09/17/2023    8:38 AM  CMP  Glucose 70 - 99 mg/dL 854  627  035   BUN 8 - 23 mg/dL 15  17  18    Creatinine 0.44 - 1.00 mg/dL 0.09  3.81  8.29   Sodium 135 - 145 mmol/L 132  132  133   Potassium 3.5 - 5.1 mmol/L 3.9  3.8  3.8   Chloride 98 - 111 mmol/L 103  102  106   CO2 22 - 32 mmol/L 24  25  21    Calcium 8.9 - 10.3 mg/dL 8.5  7.9  8.1   Total Protein 6.5 - 8.1 g/dL  6.2    Total Bilirubin <1.2 mg/dL  0.4    Alkaline Phos 38 - 126 U/L  62    AST 15 - 41 U/L  15    ALT 0 - 44 U/L  14      Imaging studies: No new pertinent imaging studies   Assessment/Plan:  67 y.o. female  with chronic diverticulitis with stricture and abscess 13 Day Post-Op s/p partial colectomy with colorectal anastomosis.  Complicated by bowel obstruction s/p robotic assisted laparoscopic lysis of adhesion postop day #3.   -Stable vital signs.  No fever.  No tachycardia -Abdominal physical exam unchanged, no tenderness to palpation, distended and tympanic -Still not passing gas, no return of bowel function -Will continue TPN -Continue IV antibiotic therapy -Pain control.  Patient able to ambulate -Will continue current management awaiting for bowel function return  Gae Gallop, MD

## 2023-09-19 NOTE — Consult Note (Signed)
PHARMACY - TOTAL PARENTERAL NUTRITION CONSULT NOTE   Indication: Prolonged ileus  Patient Measurements: Height: 5' 2.5" (158.8 cm) Weight: 64.9 kg (143 lb 1.3 oz) IBW/kg (Calculated) : 51.25 TPN AdjBW (KG): 64 Body mass index is 25.75 kg/m. Usual Weight: ~69 kg  Assessment:  67 y.o. female with chronic diverticulitis with stricture and abscess  s/p partial colectomy with colorectal anastomosis done on 09/06/23. Per surgeon, patient has been able to pass gas and had a bowel movement, but unable to advance diet. Stated that although she is very hungry, she's unable to keep anything down. Pharmacy has been consulted for TPN assistance until patient's diet is able to be advanced  Glucose / Insulin: BG 102-119 sSSI  q6h:  1 unit given in last 24 hrs   Electrolytes: borderline hyponatremia Renal: Scr<1, stable Hepatic: AST/ALT wnl Intake / Output; MIVF:  GI Imaging: 10/18 CT abdomen/pelvis:  1. Severe sigmoid colon diverticulosis with long segment of abnormal wall thickening corresponding to the area of diverticular stricture identified on recent barium enema from 07/31/2023. Imaging findings are compatible with severe chronic diverticular disease. No significant surrounding inflammatory changes or free fluid noted at this time. 11/09 repeat CT abdomen/pelvis:  1. Postop change from sigmoid resection and colorectal anastomosis. No specific findings identified to suggest anastomotic dehiscence. 2. Small fluid collection along the right iliac fossa measures 2.8 by 1.1 by 3.1 cm (volume = 5 cc). This may represent a postoperative seroma or hematoma. Abscess is considered less likely.  GI Surgeries / Procedures:  11/06: partial colectomy with colorectal anastomosis (d/t stricture and abscess) 11/16: s/p robotic assisted laparoscopic lysis of adhesion postop day #3  -on Zosyn  Central access: PICC TPN start date: 09/15/2023  Nutritional Goals: Due to current fluid shortage, goal TPN  rate has been modified to 42 mL/hr (provides 80 g of protein and 1169 kcals per day)  Will continue to monitor fluid crisis and adjust goals as appropriate.  RD Assessment: Estimated Needs Total Energy Estimated Needs: 1600-1800kcal/day Total Protein Estimated Needs: 80-90g/day Total Fluid Estimated Needs: 1.6-1.8L/day  Current Nutrition:  2nd surgery 11/16 so back to NPO  Plan:  Continue TPN at 42 mL/hr at 1800   Plan for 20% fat emulsion(SMOFLIPID) infusion to be ordered 5 days a week(Monday-Friday with weekend holiday) Electrolytes in TPN: Na 80mEq/L, K 5mEq/L, Ca 24mEq/L, Mg 34mEq/L, and Phos 53mmol/L. Cl:Ac 1:1 Continue MVI and trace elements in TPN Thiamine 100mg  IV x 5 days ordered (outside of TPN) Continue Sensitive q6h SSI and adjust as needed  Monitor TPN labs until stable, then biweekly on Mon/Thurs  Bettey Costa, PharmD Clinical Pharmacist 09/19/2023 10:54 AM

## 2023-09-19 NOTE — Plan of Care (Signed)
  Problem: Clinical Measurements: Goal: Ability to maintain clinical measurements within normal limits will improve Outcome: Progressing   

## 2023-09-19 NOTE — Care Management Important Message (Signed)
Important Message  Patient Details  Name: BABARA STEEB MRN: [id number] Date of Birth: [birth year]   Important Message Given:  Yes - Medicare IM     Bernadette Hoit 09/19/2023, 10:52 AM

## 2023-09-19 NOTE — Plan of Care (Signed)

## 2023-09-20 LAB — GLUCOSE, CAPILLARY
Glucose-Capillary: 110 mg/dL — ABNORMAL HIGH (ref 70–99)
Glucose-Capillary: 116 mg/dL — ABNORMAL HIGH (ref 70–99)
Glucose-Capillary: 127 mg/dL — ABNORMAL HIGH (ref 70–99)
Glucose-Capillary: 94 mg/dL (ref 70–99)

## 2023-09-20 LAB — BASIC METABOLIC PANEL
Anion gap: 4 — ABNORMAL LOW (ref 5–15)
BUN: 18 mg/dL (ref 8–23)
CO2: 23 mmol/L (ref 22–32)
Calcium: 8.5 mg/dL — ABNORMAL LOW (ref 8.9–10.3)
Chloride: 104 mmol/L (ref 98–111)
Creatinine, Ser: 0.52 mg/dL (ref 0.44–1.00)
GFR, Estimated: >60 mL/min (ref >60)
Glucose, Bld: 117 mg/dL — ABNORMAL HIGH (ref 70–99)
Potassium: 3.8 mmol/L (ref 3.5–5.1)
Sodium: 131 mmol/L — ABNORMAL LOW (ref 135–145)

## 2023-09-20 LAB — MAGNESIUM: Magnesium: 2.3 mg/dL (ref 1.7–2.4)

## 2023-09-20 LAB — TRIGLYCERIDES: Triglycerides: 234 mg/dL — ABNORMAL HIGH (ref <150)

## 2023-09-20 LAB — PHOSPHORUS: Phosphorus: 2.9 mg/dL (ref 2.5–4.6)

## 2023-09-20 MED ORDER — ACETAMINOPHEN 325 MG PO TABS: 650.0000 mg | ORAL_TABLET | Freq: Four times a day (QID) | ORAL | Status: DC | PRN

## 2023-09-20 MED ORDER — TRACE MINERALS CU-MN-SE-ZN 300-55-60-3000 MCG/ML IV SOLN
INTRAVENOUS | Status: AC
  Filled 2023-09-20: qty 1000

## 2023-09-20 MED ORDER — FAT EMUL FISH OIL/PLANT BASED 20% (SMOFLIPID)IV EMUL
250.0000 mL | INTRAVENOUS | Status: AC
Start: 2023-09-20 — End: 2023-09-21
  Administered 2023-09-20: 250 mL via INTRAVENOUS
  Filled 2023-09-20: qty 250

## 2023-09-20 MED ORDER — HYDROCODONE-ACETAMINOPHEN 5-325 MG PO TABS
1.0000 | ORAL_TABLET | ORAL | Status: DC | PRN
Start: 2023-09-20 — End: 2023-09-24
  Administered 2023-09-21 (×2): 1
  Administered 2023-09-22 – 2023-09-23 (×6): 2
  Administered 2023-09-23: 1
  Administered 2023-09-23 – 2023-09-24 (×7): 2
  Filled 2023-09-20 (×2): qty 2
  Filled 2023-09-20: qty 1
  Filled 2023-09-20 (×9): qty 2
  Filled 2023-09-20: qty 1
  Filled 2023-09-20 (×3): qty 2

## 2023-09-20 MED ORDER — DEXTROSE 10 % IV SOLN: INTRAVENOUS | Status: DC

## 2023-09-20 NOTE — Progress Notes (Addendum)
Nutrition Follow Up Note   DOCUMENTATION CODES:   Not applicable  INTERVENTION:   Continue TPN per pharmacy- provides 1160kcal/day and 80g/day protein (M-F)  Daily weights   NUTRITION DIAGNOSIS:   Inadequate oral intake related to acute illness as evidenced by meal completion < 25%. -ongoing   GOAL:   Patient will meet greater than or equal to 90% of their needs -not met   MONITOR:   Diet advancement, Labs, Weight trends, Skin, I & O's, TPN  ASSESSMENT:   67 y/o female with h/o hiatal hernia, GERD and complicated diverticulitis with colon stricture and perforation with abscess who is s/p robotic assisted laparoscopic sigmoid colectomy with splenic flexure takedown and cystoscopy 11/6 complicated by post op ileus and SBO requiring robotic assisted laparoscopic lysis of adhesions 11/16.  Met with pt in room today. Pt reports that she is feeling a little better; pt does report some abdominal soreness and distension.  Pt s/p return to the OR on 11/16 for SBO. Pt remains NPO and is awaiting bowel regimen. NGT in place with output. Pt tolerating TPN at goal rate but is not able to meet her estimated needs secondary to the current TPN shortages. Refeed labs stable. Per chart, pt is down ~11lbs since admission; pt is down ~6lbs from her UBW. Pt does appear to have increased depletions in her legs and clavicles today compared to her last exam. RD will continue to monitor pt's weights. RD will add supplements with diet advancement to help pt meet her estimated needs. Pt reports that she was unable to tolerate the [facility] as it made her vomit. Pt is willing to try a supplement containing whey as she reports that she was able to eat some ice cream without sneezing (vanilla or chocolate).   Medications reviewed and include: lovenox, insulin, protonix, zosyn, TPN  Labs reviewed: Na 131(L), K 3.8 wnl, P 2.9 wnl, Mg 2.3 wnl Triglycerides 234(H) Wbc- 21.1(H), Hgb 10.7(L), Hct  32.2(L) Cbgs- 116, 110 x 24 hrs   Nutrition Follow Up Note:  Flowsheet Row Most Recent Value  Orbital Region No depletion  Upper Arm Region No depletion  Thoracic and Lumbar Region No depletion  Buccal Region No depletion  Temple Region No depletion  Clavicle Bone Region Mild depletion  Clavicle and Acromion Bone Region Mild depletion  Scapular Bone Region No depletion  Dorsal Hand No depletion  Patellar Region Severe depletion  Anterior Thigh Region Severe depletion  Posterior Calf Region Severe depletion  Edema (RD Assessment) None  Hair Reviewed  Eyes Reviewed  Mouth Reviewed  Skin Reviewed  Nails Reviewed   Diet Order:   Diet Order             Diet NPO time specified  Diet effective now                  EDUCATION NEEDS:   Education needs have been addressed  Skin:  Skin Assessment: Reviewed RN Assessment (incision abdomen)  Last BM:  11/15- type 6  Height:   Ht Readings from Last 1 Encounters:  09/06/23 5' 2.5" (1.588 m)    Weight:   Wt Readings from Last 1 Encounters:  09/20/23 64.4 kg    Ideal Body Weight:  52 kg  BMI:  Body mass index is 25.55 kg/m.  Estimated Nutritional Needs:   Kcal:  1600-1800kcal/day  Protein:  80-90g/day  Fluid:  1.6-1.8L/day  Betsey Holiday MS, RD, LDN Please refer to [facility] for RD and/or RD on-call/weekend/after  hours pager

## 2023-09-20 NOTE — Progress Notes (Signed)
Patient ID: Mariah Cooper, female   DOB: 1956/07/10, 67 y.o.   MRN: [id number]     SURGICAL PROGRESS NOTE   Hospital Day(s): 14.   Interval History: Patient seen and examined, no acute events or new complaints overnight. Patient reports feeling the same.  She denies any abdominal pain.  She is still not passing flatus.  She does not feel uncomfortable but still feels mildly distended.  She has been able to ambulate.  Vital signs in last 24 hours: [min-max] current  Temp:  [98 F (36.7 C)-98.7 F (37.1 C)] 98 F (36.7 C) (11/20 0800) Pulse Rate:  [91-95] 95 (11/20 0800) Resp:  [16-18] 16 (11/20 0800) BP: (122-132)/(64-74) 122/66 (11/20 0800) SpO2:  [96 %-100 %] 100 % (11/20 0800) Weight:  [64.4 kg] 64.4 kg (11/20 0344)     Height: 5' 2.5" (158.8 cm) Weight: 64.4 kg BMI (Calculated): 25.54   Physical Exam:  Constitutional: alert, cooperative and no distress  Respiratory: breathing non-labored at rest  Cardiovascular: regular rate and sinus rhythm  Gastrointestinal: soft, non-tender, but distended  Labs:     Latest Ref Rng & Units 09/17/2023    3:36 AM 09/16/2023    7:52 AM 09/14/2023    7:49 AM  CBC  WBC 4.0 - 10.5 K/uL 21.1  11.5  14.5   Hemoglobin 12.0 - 15.0 g/dL 40.9  81.1  91.4   Hematocrit 36.0 - 46.0 % 32.2  31.8  33.2   Platelets 150 - 400 K/uL 653  630  723       Latest Ref Rng & Units 09/20/2023    3:54 AM 09/19/2023    5:32 AM 09/18/2023    3:39 AM  CMP  Glucose 70 - 99 mg/dL 782  956  213   BUN 8 - 23 mg/dL 18  15  17    Creatinine 0.44 - 1.00 mg/dL 0.86  5.78  4.69   Sodium 135 - 145 mmol/L 131  132  132   Potassium 3.5 - 5.1 mmol/L 3.8  3.9  3.8   Chloride 98 - 111 mmol/L 104  103  102   CO2 22 - 32 mmol/L 23  24  25    Calcium 8.9 - 10.3 mg/dL 8.5  8.5  7.9   Total Protein 6.5 - 8.1 g/dL   6.2   Total Bilirubin <1.2 mg/dL   0.4   Alkaline Phos 38 - 126 U/L   62   AST 15 - 41 U/L   15   ALT 0 - 44 U/L   14     Imaging studies: No new  pertinent imaging studies   Assessment/Plan:  67 y.o. female with chronic diverticulitis with stricture and abscess 14 Day Post-Op s/p partial colectomy with colorectal anastomosis.  Complicated by bowel obstruction s/p robotic assisted laparoscopic lysis of adhesion postop day #4.    -Stable vital signs.  No fever.  No tachycardia -Abdominal physical exam unchanged, no tenderness to palpation, distended and tympanic -Still not passing gas, no return of bowel function -Will continue TPN -With that the patient drink black coffee to see if this help with ileus. -Continue IV antibiotic therapy -Pain control.  Patient able to ambulate -Will continue current management awaiting for bowel function return  Gae Gallop, MD

## 2023-09-20 NOTE — Consult Note (Addendum)
PHARMACY - TOTAL PARENTERAL NUTRITION CONSULT NOTE   Indication: Prolonged ileus  Patient Measurements: Height: 5' 2.5" (158.8 cm) Weight: 64.4 kg (141 lb 15.6 oz) IBW/kg (Calculated) : 51.25 TPN AdjBW (KG): 64 Body mass index is 25.55 kg/m. Usual Weight: ~69 kg  Assessment:  67 y.o. female with chronic diverticulitis with stricture and abscess  s/p partial colectomy with colorectal anastomosis done on 09/06/23. Per surgeon, patient has been able to pass gas and had a bowel movement, but unable to advance diet. Stated that although she is very hungry, she's unable to keep anything down. Pharmacy has been consulted for TPN assistance until patient's diet is able to be advanced  Glucose / Insulin: BG 105-121 sSSI  q6h:  1 unit given in last 24 hrs   Electrolytes: borderline hyponatremia(looks to trend in the low 130s) Renal: Scr<1, stable Hepatic: AST/ALT wnl Intake / Output; MIVF:  GI Imaging: 10/18 CT abdomen/pelvis:  1. Severe sigmoid colon diverticulosis with long segment of abnormal wall thickening corresponding to the area of diverticular stricture identified on recent barium enema from 07/31/2023. Imaging findings are compatible with severe chronic diverticular disease. No significant surrounding inflammatory changes or free fluid noted at this time. 11/09 repeat CT abdomen/pelvis:  1. Postop change from sigmoid resection and colorectal anastomosis. No specific findings identified to suggest anastomotic dehiscence. 2. Small fluid collection along the right iliac fossa measures 2.8 by 1.1 by 3.1 cm (volume = 5 cc). This may represent a postoperative seroma or hematoma. Abscess is considered less likely.  GI Surgeries / Procedures:  11/06: partial colectomy with colorectal anastomosis (d/t stricture and abscess) 11/16: s/p robotic assisted laparoscopic lysis of adhesion postop day #4 -on Zosyn  Central access: PICC TPN start date: 09/15/2023  Nutritional Goals: Due to  current fluid shortage, goal TPN rate has been modified to 42 mL/hr (provides 80 g of protein and 1169 kcals per day)  Will continue to monitor fluid crisis and adjust goals as appropriate.  RD Assessment: Estimated Needs Total Energy Estimated Needs: 1600-1800kcal/day Total Protein Estimated Needs: 80-90g/day Total Fluid Estimated Needs: 1.6-1.8L/day  Current Nutrition:  2nd surgery 11/16 so back to NPO  Plan:  - Continue TPN at 42 mL/hr at 1800   - Will order D10 infusion at 56mL/hr to add additional~340 kcal as patient is down roughly 6 pounds from her normal weight - Plan for 20% fat emulsion(SMOFLIPID) infusion to be ordered 5 days a week(Monday-Friday with weekend holiday) Per discussion with dietary and primary team, although patient has elevated triglycerides, okay to continue current lipids plan. Will continue to monitor and adjust plan if TG>400 Electrolytes in TPN: Na 77mEq/L, K 92mEq/L, Ca 64mEq/L, Mg 53mEq/L, and Phos 40mmol/L. Cl:Ac 1:1 Continue MVI and trace elements in TPN Thiamine 100mg  IV x 5 days(completed) Continue Sensitive q6h SSI and adjust as needed  Monitor TPN labs until stable, then biweekly on Mon/Thurs  Bettey Costa, PharmD Clinical Pharmacist 09/20/2023 10:17 AM

## 2023-09-21 ENCOUNTER — Inpatient Hospital Stay: Payer: Medicare Other

## 2023-09-21 LAB — CBC
HCT: 32.9 % — ABNORMAL LOW (ref 36.0–46.0)
Hemoglobin: 11.1 g/dL — ABNORMAL LOW (ref 12.0–15.0)
MCH: 30.6 pg (ref 26.0–34.0)
MCHC: 33.7 g/dL (ref 30.0–36.0)
MCV: 90.6 fL (ref 80.0–100.0)
Platelets: 515 10*3/uL — ABNORMAL HIGH (ref 150–400)
RBC: 3.63 MIL/uL — ABNORMAL LOW (ref 3.87–5.11)
RDW: 13 % (ref 11.5–15.5)
WBC: 12.1 10*3/uL — ABNORMAL HIGH (ref 4.0–10.5)
nRBC: 0 % (ref 0.0–0.2)

## 2023-09-21 LAB — MAGNESIUM: Magnesium: 2.3 mg/dL (ref 1.7–2.4)

## 2023-09-21 LAB — COMPREHENSIVE METABOLIC PANEL
ALT: 36 U/L (ref 0–44)
AST: 31 U/L (ref 15–41)
Albumin: 3.2 g/dL — ABNORMAL LOW (ref 3.5–5.0)
Alkaline Phosphatase: 102 U/L (ref 38–126)
Anion gap: 9 (ref 5–15)
BUN: 18 mg/dL (ref 8–23)
CO2: 22 mmol/L (ref 22–32)
Calcium: 9.3 mg/dL (ref 8.9–10.3)
Chloride: 104 mmol/L (ref 98–111)
Creatinine, Ser: 0.55 mg/dL (ref 0.44–1.00)
GFR, Estimated: >60 mL/min (ref >60)
Glucose, Bld: 126 mg/dL — ABNORMAL HIGH (ref 70–99)
Potassium: 3.7 mmol/L (ref 3.5–5.1)
Sodium: 135 mmol/L (ref 135–145)
Total Bilirubin: 0.2 mg/dL (ref <1.2)
Total Protein: 7 g/dL (ref 6.5–8.1)

## 2023-09-21 LAB — PHOSPHORUS: Phosphorus: 3.2 mg/dL (ref 2.5–4.6)

## 2023-09-21 LAB — GLUCOSE, CAPILLARY
Glucose-Capillary: 160 mg/dL — ABNORMAL HIGH (ref 70–99)
Glucose-Capillary: 135 mg/dL — ABNORMAL HIGH (ref 70–99)
Glucose-Capillary: 103 mg/dL — ABNORMAL HIGH (ref 70–99)

## 2023-09-21 MED ORDER — FAT EMUL FISH OIL/PLANT BASED 20% (SMOFLIPID)IV EMUL
250.0000 mL | INTRAVENOUS | Status: AC
Start: 2023-09-21 — End: 2023-09-22
  Administered 2023-09-21: 250 mL via INTRAVENOUS
  Filled 2023-09-21: qty 250

## 2023-09-21 MED ORDER — TRACE MINERALS CU-MN-SE-ZN 300-55-60-3000 MCG/ML IV SOLN
INTRAVENOUS | Status: AC
  Filled 2023-09-21: qty 1000

## 2023-09-21 NOTE — Consult Note (Signed)
PHARMACY - TOTAL PARENTERAL NUTRITION CONSULT NOTE   Indication: Prolonged ileus  Patient Measurements: Height: 5' 2.5" (158.8 cm) Weight: 65.9 kg (145 lb 4.5 oz) IBW/kg (Calculated) : 51.25 TPN AdjBW (KG): 64 Body mass index is 26.15 kg/m. Usual Weight: ~69 kg  Assessment:  67 y.o. female with chronic diverticulitis with stricture and abscess  s/p partial colectomy with colorectal anastomosis done on 09/06/23. Per surgeon, patient has been able to pass gas and had a bowel movement, but unable to advance diet. Stated that although she is very hungry, she's unable to keep anything down. Pharmacy has been consulted for TPN assistance until patient's diet is able to be advanced  Glucose / Insulin: BG 94 - 160 sSSI  q6h:  3 units given in last 24 hrs   Electrolytes: wnl Renal: Scr<1, stable Hepatic: AST/ALT wnl Intake / Output;net (+) 2.6L GI Imaging: 10/18 CT abdomen/pelvis:  1. Severe sigmoid colon diverticulosis with long segment of abnormal wall thickening corresponding to the area of diverticular stricture identified on recent barium enema from 07/31/2023. Imaging findings are compatible with severe chronic diverticular disease. No significant surrounding inflammatory changes or free fluid noted at this time. 11/09 repeat CT abdomen/pelvis:  1. Postop change from sigmoid resection and colorectal anastomosis. No specific findings identified to suggest anastomotic dehiscence. 2. Small fluid collection along the right iliac fossa measures 2.8 by 1.1 by 3.1 cm (volume = 5 cc). This may represent a postoperative seroma or hematoma. Abscess is considered less likely.  GI Surgeries / Procedures:  11/06: partial colectomy with colorectal anastomosis (d/t stricture and abscess) 11/16: s/p robotic assisted laparoscopic lysis of adhesion postop day #4 -on Zosyn  Central access: PICC TPN start date: 09/15/2023  Nutritional Goals: Due to current fluid shortage, goal TPN rate has been  modified to 42 mL/hr (provides 80 g of protein and 1169 kcals per day)  Will continue to monitor fluid crisis and adjust goals as appropriate.  RD Assessment: Estimated Needs Total Energy Estimated Needs: 1600-1800kcal/day Total Protein Estimated Needs: 80-90g/day Total Fluid Estimated Needs: 1.6-1.8L/day  Current Nutrition: CLD  Plan:  ---Continue TPN at 42 mL/hr ---Will stop D10 infusion  ---Plan for 20% fat emulsion(SMOFLIPID) infusion to be ordered 5 days a week(Monday-Friday with weekend holiday) Per discussion with dietary and primary team, although patient has elevated triglycerides, okay to continue current lipids plan. Will continue to monitor and adjust plan if TG>400 ---Electrolytes in TPN: Na 63mEq/L, K 52mEq/L, Ca 92mEq/L, Mg 57mEq/L, and Phos 65mmol/L. Cl:Ac 1:1 ---Continue MVI and trace elements in TPN ---Thiamine 100mg  IV x 5 days(completed) ---Continue Sensitive q6h SSI and adjust as needed  ---Monitor TPN labs until stable, then biweekly on Mon/Thurs  Lowella Bandy, PharmD Clinical Pharmacist 09/21/2023 7:06 AM

## 2023-09-21 NOTE — Progress Notes (Signed)
Patient ID: Mariah Cooper, female   DOB: 1956-03-17, 67 y.o.   MRN: [id number]     SURGICAL PROGRESS NOTE   Hospital Day(s): 15.   Interval History: Patient seen and examined, no acute events or new complaints overnight. Patient reports she is feeling a little bit better this morning.  The patient endorses that she passed 3 episode of gas last night.  She denies abdominal pain  Vital signs in last 24 hours: [min-max] current  Temp:  [98.2 F (36.8 C)-98.9 F (37.2 C)] 98.2 F (36.8 C) (11/21 0439) Pulse Rate:  [87-93] 90 (11/21 0951) Resp:  [16-18] 17 (11/21 0951) BP: (113-139)/(61-80) 134/75 (11/21 0951) SpO2:  [96 %-100 %] 96 % (11/21 0951) Weight:  [65.9 kg] 65.9 kg (11/21 0437)     Height: 5' 2.5" (158.8 cm) Weight: 65.9 kg BMI (Calculated): 26.13   Physical Exam:  Constitutional: alert, cooperative and no distress  Respiratory: breathing non-labored at rest  Cardiovascular: regular rate and sinus rhythm  Gastrointestinal: soft, non-tender, and distended  Labs:     Latest Ref Rng & Units 09/21/2023    5:00 AM 09/17/2023    3:36 AM 09/16/2023    7:52 AM  CBC  WBC 4.0 - 10.5 K/uL 12.1  21.1  11.5   Hemoglobin 12.0 - 15.0 g/dL 62.1  30.8  65.7   Hematocrit 36.0 - 46.0 % 32.9  32.2  31.8   Platelets 150 - 400 K/uL 515  653  630       Latest Ref Rng & Units 09/21/2023    5:00 AM 09/20/2023    3:54 AM 09/19/2023    5:32 AM  CMP  Glucose 70 - 99 mg/dL 846  962  952   BUN 8 - 23 mg/dL 18  18  15    Creatinine 0.44 - 1.00 mg/dL 8.41  3.24  4.01   Sodium 135 - 145 mmol/L 135  131  132   Potassium 3.5 - 5.1 mmol/L 3.7  3.8  3.9   Chloride 98 - 111 mmol/L 104  104  103   CO2 22 - 32 mmol/L 22  23  24    Calcium 8.9 - 10.3 mg/dL 9.3  8.5  8.5   Total Protein 6.5 - 8.1 g/dL 7.0     Total Bilirubin <1.2 mg/dL 0.2     Alkaline Phos 38 - 126 U/L 102     AST 15 - 41 U/L 31     ALT 0 - 44 U/L 36       Imaging studies: Abdominal x-ray today showed small and large bowel  dilation with gas throughout the small bowel and large bowel.   Assessment/Plan:  67 y.o. female with chronic diverticulitis with stricture and abscess 15 Day Post-Op s/p partial colectomy with colorectal anastomosis.  Complicated by bowel obstruction s/p robotic assisted laparoscopic lysis of adhesion postop day #5.    -Stable vital signs.  No fever.  No tachycardia -Abdominal physical exam unchanged, no tenderness to palpation, distended and tympanic -She was passing small amount of gas last night.  Abdominal x-ray shows gas in the large intestine.  Still with small bowel dilation.  Will give a trial of critically diet. -Will continue TPN -With that the patient drink black coffee to see if this help with ileus. -Continue IV antibiotic therapy -Pain control.  Patient able to ambulate -Will continue current management awaiting for bowel function return  Gae Gallop, MD

## 2023-09-22 ENCOUNTER — Inpatient Hospital Stay: Payer: Medicare Other

## 2023-09-22 LAB — BASIC METABOLIC PANEL
Anion gap: 9 (ref 5–15)
BUN: 23 mg/dL (ref 8–23)
CO2: 22 mmol/L (ref 22–32)
Calcium: 9.3 mg/dL (ref 8.9–10.3)
Chloride: 103 mmol/L (ref 98–111)
Creatinine, Ser: 0.61 mg/dL (ref 0.44–1.00)
GFR, Estimated: >60 mL/min (ref >60)
Glucose, Bld: 111 mg/dL — ABNORMAL HIGH (ref 70–99)
Potassium: 3.7 mmol/L (ref 3.5–5.1)
Sodium: 134 mmol/L — ABNORMAL LOW (ref 135–145)

## 2023-09-22 LAB — GLUCOSE, CAPILLARY
Glucose-Capillary: 106 mg/dL — ABNORMAL HIGH (ref 70–99)
Glucose-Capillary: 109 mg/dL — ABNORMAL HIGH (ref 70–99)

## 2023-09-22 LAB — TRIGLYCERIDES: Triglycerides: 235 mg/dL — ABNORMAL HIGH (ref <150)

## 2023-09-22 MED ORDER — FAT EMUL FISH OIL/PLANT BASED 20% (SMOFLIPID)IV EMUL
250.0000 mL | INTRAVENOUS | Status: AC
  Administered 2023-09-22: 250 mL via INTRAVENOUS
  Filled 2023-09-22: qty 250

## 2023-09-22 MED ORDER — TRACE MINERALS CU-MN-SE-ZN 300-55-60-3000 MCG/ML IV SOLN
INTRAVENOUS | Status: AC
  Filled 2023-09-22: qty 1000

## 2023-09-22 NOTE — Care Management Important Message (Signed)
Important Message  Patient Details  Name: Mariah Cooper MRN: [id number] Date of Birth: 21-Nov-1955   Important Message Given:  Yes - Medicare IM     [name] [name] 09/22/2023, 12:28 PM

## 2023-09-22 NOTE — Consult Note (Signed)
PHARMACY - TOTAL PARENTERAL NUTRITION CONSULT NOTE   Indication: Prolonged ileus  Patient Measurements: Height: 5' 2.5" (158.8 cm) Weight: 63 kg (138 lb 14.2 oz) IBW/kg (Calculated) : 51.25 TPN AdjBW (KG): 64 Body mass index is 25 kg/m. Usual Weight: ~69 kg  Assessment:  67 y.o. female with chronic diverticulitis with stricture and abscess  s/p partial colectomy with colorectal anastomosis done on 09/06/23. Per surgeon, patient has been able to pass gas and had a bowel movement, but unable to advance diet. Stated that although she is very hungry, she's unable to keep anything down. Pharmacy has been consulted for TPN assistance until patient's diet is able to be advanced  Glucose / Insulin: BG 70 - 135 sSSI  q6h:  1 unit given in last 24 hrs   Electrolytes: wnl Renal: Scr<1, stable Hepatic: AST/ALT wnl Intake / Output;net (+) 4.5L GI Imaging: 10/18 CT abdomen/pelvis:  1. Severe sigmoid colon diverticulosis with long segment of abnormal wall thickening corresponding to the area of diverticular stricture identified on recent barium enema from 07/31/2023. Imaging findings are compatible with severe chronic diverticular disease. No significant surrounding inflammatory changes or free fluid noted at this time. 11/09 repeat CT abdomen/pelvis:  1. Postop change from sigmoid resection and colorectal anastomosis. No specific findings identified to suggest anastomotic dehiscence. 2. Small fluid collection along the right iliac fossa measures 2.8 by 1.1 by 3.1 cm (volume = 5 cc). This may represent a postoperative seroma or hematoma. Abscess is considered less likely.  GI Surgeries / Procedures:  11/06: partial colectomy with colorectal anastomosis (d/t stricture and abscess) 11/16: s/p robotic assisted laparoscopic lysis of adhesion  -on Zosyn  Central access: PICC TPN start date: 09/15/2023  Nutritional Goals: Due to current fluid shortage, goal TPN rate has been modified to 42 mL/hr  (provides 80 g of protein and 1169 kcals per day)  Will continue to monitor fluid crisis and adjust goals as appropriate.  RD Assessment: Estimated Needs Total Energy Estimated Needs: 1600-1800kcal/day Total Protein Estimated Needs: 80-90g/day Total Fluid Estimated Needs: 1.6-1.8L/day  Current Nutrition: CLD  Plan:  ---Continue E8/10 TPN at 42 mL/hr ---Plan for 20% fat emulsion(SMOFLIPID) infusion to be ordered 7 days a week  Per discussion with dietary and primary team, although patient has elevated triglycerides, okay to continue current lipids plan. Will continue to monitor and adjust plan if TG>400 ---Electrolytes in TPN: Na 44mEq/L, K 63mEq/L, Ca 31mEq/L, Mg 16mEq/L, and Phos 79mmol/L. Cl:Ac 1:1 ---Continue MVI and trace elements in TPN ---Thiamine 100mg  IV x 5 days(completed) ---stop Sensitive q6h SSI  ---Monitor TPN labs until stable, then biweekly on Mon/Thurs  Lowella Bandy, PharmD Clinical Pharmacist 09/22/2023 7:16 AM

## 2023-09-22 NOTE — Care Management Important Message (Signed)
Important Message  Patient Details  Name: Mariah Cooper MRN: [id number] Date of Birth: 09-Sep-1956   Important Message Given:  Yes - Medicare IM     Olegario Messier A Lyzette Reinhardt 09/22/2023, 12:30 PM

## 2023-09-22 NOTE — Progress Notes (Signed)
Patient ID: ASTRYD FRIEL, female   DOB: 1956/09/02, 67 y.o.   MRN: [id number]     SURGICAL PROGRESS NOTE   Hospital Day(s): 16.   Interval History: Patient seen and examined, no acute events or new complaints overnight. Patient reports she passed gas again and had a bowel movement this morning.  She is still feeling distended.  She was clamped most of the day yesterday.  Vital signs in last 24 hours: [min-max] current  Temp:  [97.9 F (36.6 C)-98.4 F (36.9 C)] 98.1 F (36.7 C) (11/22 0808) Pulse Rate:  [89-103] 96 (11/22 0808) Resp:  [16-18] 18 (11/22 0808) BP: (132-135)/(72-83) 135/83 (11/22 0808) SpO2:  [96 %-100 %] 96 % (11/22 0808) Weight:  [63 kg] 63 kg (11/22 0500)     Height: 5' 2.5" (158.8 cm) Weight: 63 kg BMI (Calculated): 24.98   Physical Exam:  Constitutional: alert, cooperative and no distress  Respiratory: breathing non-labored at rest  Cardiovascular: regular rate and sinus rhythm  Gastrointestinal: soft, non-tender, but distended and tympanic  Labs:     Latest Ref Rng & Units 09/21/2023    5:00 AM 09/17/2023    3:36 AM 09/16/2023    7:52 AM  CBC  WBC 4.0 - 10.5 K/uL 12.1  21.1  11.5   Hemoglobin 12.0 - 15.0 g/dL 40.9  81.1  91.4   Hematocrit 36.0 - 46.0 % 32.9  32.2  31.8   Platelets 150 - 400 K/uL 515  653  630       Latest Ref Rng & Units 09/22/2023    5:05 AM 09/21/2023    5:00 AM 09/20/2023    3:54 AM  CMP  Glucose 70 - 99 mg/dL 782  956  213   BUN 8 - 23 mg/dL 23  18  18    Creatinine 0.44 - 1.00 mg/dL 0.86  5.78  4.69   Sodium 135 - 145 mmol/L 134  135  131   Potassium 3.5 - 5.1 mmol/L 3.7  3.7  3.8   Chloride 98 - 111 mmol/L 103  104  104   CO2 22 - 32 mmol/L 22  22  23    Calcium 8.9 - 10.3 mg/dL 9.3  9.3  8.5   Total Protein 6.5 - 8.1 g/dL  7.0    Total Bilirubin <1.2 mg/dL  0.2    Alkaline Phos 38 - 126 U/L  102    AST 15 - 41 U/L  31    ALT 0 - 44 U/L  36      Imaging studies: Abdominal x-ray this morning shows diffuse small  bowel dilation.  There is also again gas in the large intestine.  No significant improvement compared to x-ray yesterday.  No air-fluid level which is consistent more with ileus.   Assessment/Plan:  67 y.o. female with chronic diverticulitis with stricture and abscess 16 Day Post-Op s/p partial colectomy with colorectal anastomosis.  Complicated by bowel obstruction s/p robotic assisted laparoscopic lysis of adhesion postop day #6.    -Stable vital signs.  No fever.  No tachycardia -Abdominal physical exam unchanged, no tenderness to palpation, distended and tympanic -She continue passing small amount of gas and had a bowel movement.  Abdominal x-ray without significant improvement.  I think that she is starting to move the intestine but is slower than what she can get.  I put the NGT to suction and I got 300 cc right away.  This is consistent with persistent ileus.  Would not recommend  to remove NGT yet.  Patient frustrated but agreed. -Will continue TPN -With that the patient drink black coffee to see if this help with ileus. -Continue IV antibiotic therapy -Pain control.  Patient able to ambulate   Gae Gallop, MD

## 2023-09-22 NOTE — Plan of Care (Signed)

## 2023-09-23 MED ORDER — TRACE MINERALS CU-MN-SE-ZN 300-55-60-3000 MCG/ML IV SOLN
INTRAVENOUS | Status: AC
  Filled 2023-09-23: qty 1000

## 2023-09-23 MED ORDER — FAT EMUL FISH OIL/PLANT BASED 20% (SMOFLIPID)IV EMUL
250.0000 mL | INTRAVENOUS | Status: AC
  Administered 2023-09-23: 250 mL via INTRAVENOUS
  Filled 2023-09-23: qty 250

## 2023-09-23 NOTE — Consult Note (Signed)
PHARMACY - TOTAL PARENTERAL NUTRITION CONSULT NOTE   Indication: Prolonged ileus  Patient Measurements: Height: 5' 2.5" (158.8 cm) Weight: 63.4 kg (139 lb 12.4 oz) IBW/kg (Calculated) : 51.25 TPN AdjBW (KG): 64 Body mass index is 25.16 kg/m. Usual Weight: ~69 kg  Assessment:  67 y.o. female with chronic diverticulitis with stricture and abscess  s/p partial colectomy with colorectal anastomosis done on 09/06/23. Per surgeon, patient has been able to pass gas and had a bowel movement, but unable to advance diet. Stated that although she is very hungry, she's unable to keep anything down. Pharmacy has been consulted for TPN assistance until patient's diet is able to be advanced  Glucose / Insulin: BG 70 - 135 sSSI  q6h:  1 unit given in last 24 hrs   Electrolytes: wnl Renal: Scr<1, stable Hepatic: AST/ALT wnl Intake / Output;net (+) 4.5L GI Imaging: 10/18 CT abdomen/pelvis:  1. Severe sigmoid colon diverticulosis with long segment of abnormal wall thickening corresponding to the area of diverticular stricture identified on recent barium enema from 07/31/2023. Imaging findings are compatible with severe chronic diverticular disease. No significant surrounding inflammatory changes or free fluid noted at this time. 11/09 repeat CT abdomen/pelvis:  1. Postop change from sigmoid resection and colorectal anastomosis. No specific findings identified to suggest anastomotic dehiscence. 2. Small fluid collection along the right iliac fossa measures 2.8 by 1.1 by 3.1 cm (volume = 5 cc). This may represent a postoperative seroma or hematoma. Abscess is considered less likely.  GI Surgeries / Procedures:  11/06: partial colectomy with colorectal anastomosis (d/t stricture and abscess) 11/16: s/p robotic assisted laparoscopic lysis of adhesion  -on Zosyn  Central access: PICC TPN start date: 09/15/2023  Nutritional Goals: Due to current fluid shortage, goal TPN rate has been modified to 42  mL/hr (provides 80 g of protein and 1169 kcals per day)  Will continue to monitor fluid crisis and adjust goals as appropriate.  RD Assessment: Estimated Needs Total Energy Estimated Needs: 1600-1800kcal/day Total Protein Estimated Needs: 80-90g/day Total Fluid Estimated Needs: 1.6-1.8L/day  Current Nutrition: CLD  Plan:  ---Continue E8/10 TPN at 42 mL/hr ---Plan for 20% fat emulsion(SMOFLIPID) infusion to be ordered 7 days a week  Per discussion with dietary and primary team, although patient has elevated triglycerides, okay to continue current lipids plan. Will continue to monitor and adjust plan if TG>400 ---Electrolytes in TPN: Na 60mEq/L, K 44mEq/L, Ca 54mEq/L, Mg 59mEq/L, and Phos 30mmol/L. Cl:Ac 1:1 ---Continue MVI and trace elements in TPN ---Thiamine 100mg  IV x 5 days(completed) ---Monitor TPN labs until stable, then biweekly on Mon/Thurs  Bettey Costa, PharmD Clinical Pharmacist 09/23/2023 11:22 AM

## 2023-09-23 NOTE — Progress Notes (Signed)
The patient reports he did not eat dinner as he has no appetite. Had 1/2 of a pancake, 1/2 of his lunch.

## 2023-09-23 NOTE — Progress Notes (Signed)
Patient ID: Mariah Cooper, female   DOB: [birth year], 67 y.o.   MRN: [id number]     SURGICAL PROGRESS NOTE   Hospital Day(s): 17.   Interval History: Patient seen and examined, no acute events or new complaints overnight. Patient reports having significant sore throat.  Denies abdominal pain.  She endorses continued passing gas.  Vital signs in last 24 hours: [min-max] current  Temp:  [97.4 F (36.3 C)-98.6 F (37 C)] 97.4 F (36.3 C) (11/23 0809) Pulse Rate:  [94-103] 103 (11/23 0809) Resp:  [16-18] 18 (11/23 0809) BP: (121-131)/(61-76) 122/68 (11/23 0809) SpO2:  [94 %-100 %] 100 % (11/23 0809) Weight:  [63.4 kg] 63.4 kg (11/23 0422)     Height: 5' 2.5" (158.8 cm) Weight: 63.4 kg BMI (Calculated): 25.14   Physical Exam:  Constitutional: alert, cooperative and no distress  Respiratory: breathing non-labored at rest  Cardiovascular: regular rate and sinus rhythm  Gastrointestinal: soft, non-tender, but distended and tympanic  Labs:     Latest Ref Rng & Units 09/21/2023    5:00 AM 09/17/2023    3:36 AM 09/16/2023    7:52 AM  CBC  WBC 4.0 - 10.5 K/uL 12.1  21.1  11.5   Hemoglobin 12.0 - 15.0 g/dL 62.1  30.8  65.7   Hematocrit 36.0 - 46.0 % 32.9  32.2  31.8   Platelets 150 - 400 K/uL 515  653  630       Latest Ref Rng & Units 09/22/2023    5:05 AM 09/21/2023    5:00 AM 09/20/2023    3:54 AM  CMP  Glucose 70 - 99 mg/dL 846  962  952   BUN 8 - 23 mg/dL 23  18  18    Creatinine 0.44 - 1.00 mg/dL 8.41  3.24  4.01   Sodium 135 - 145 mmol/L 134  135  131   Potassium 3.5 - 5.1 mmol/L 3.7  3.7  3.8   Chloride 98 - 111 mmol/L 103  104  104   CO2 22 - 32 mmol/L 22  22  23    Calcium 8.9 - 10.3 mg/dL 9.3  9.3  8.5   Total Protein 6.5 - 8.1 g/dL  7.0    Total Bilirubin <1.2 mg/dL  0.2    Alkaline Phos 38 - 126 U/L  102    AST 15 - 41 U/L  31    ALT 0 - 44 U/L  36      Imaging studies: No new pertinent imaging studies   Assessment/Plan:  67 y.o. female with chronic  diverticulitis with stricture and abscess 17 Day Post-Op s/p partial colectomy with colorectal anastomosis.  Complicated by bowel obstruction s/p robotic assisted laparoscopic lysis of adhesion postop day #7.   -Patient continue without any significant clinical changes. -Continue with prolonged postoperative ileus.  This seems to be resolving very slowly as patient is passing gas but still distended and tympanic -This morning gastric residual was 100 cc which is significantly less compared to 300 cc yesterday -Again I offered the patient to keep the NGT since she is still distended and tympanic versus removing the NGT to give her for sore throat relief but with high risk of needing to put the NGT back.  Due to the risk of needing to put the NGT back she will prefer to keep the NGT in place -Encouraged the patient to ambulate -May continue drinking coffee, sips of clear liquids for comfort -Continue TPN -Continue IV antibiotic therapy -Continue  pain control  Gae Gallop, MD

## 2023-09-23 NOTE — Plan of Care (Signed)
The patient reports passing gas today but no BM. Family at the bedside has been assisting the patient to ambulate and do 5 laps around the unit  when she gets up. NG tube has been clamped. Meds given through NG tube. The patient had a shower today. TPN are provided per order.  Problem: Education: Goal: Knowledge of General Education information will improve Description: Including pain rating scale, medication(s)/side effects and non-pharmacologic comfort measures Outcome: Progressing   Problem: Health Behavior/Discharge Planning: Goal: Ability to manage health-related needs will improve Outcome: Progressing   Problem: Clinical Measurements: Goal: Ability to maintain clinical measurements within normal limits will improve Outcome: Progressing Goal: Will remain free from infection Outcome: Progressing Goal: Diagnostic test results will improve Outcome: Progressing Goal: Respiratory complications will improve Outcome: Progressing Goal: Cardiovascular complication will be avoided Outcome: Progressing   Problem: Activity: Goal: Risk for activity intolerance will decrease Outcome: Progressing   Problem: Nutrition: Goal: Adequate nutrition will be maintained Outcome: Progressing   Problem: Coping: Goal: Level of anxiety will decrease Outcome: Progressing   Problem: Elimination: Goal: Will not experience complications related to bowel motility Outcome: Progressing Goal: Will not experience complications related to urinary retention Outcome: Progressing   Problem: Pain Management: Goal: General experience of comfort will improve Outcome: Progressing   Problem: Safety: Goal: Ability to remain free from injury will improve Outcome: Progressing   Problem: Skin Integrity: Goal: Risk for impaired skin integrity will decrease Outcome: Progressing

## 2023-09-24 MED ORDER — ACETAMINOPHEN 325 MG PO TABS: 650.0000 mg | ORAL_TABLET | Freq: Four times a day (QID) | ORAL | Status: DC | PRN

## 2023-09-24 MED ORDER — FAT EMUL FISH OIL/PLANT BASED 20% (SMOFLIPID)IV EMUL
250.0000 mL | INTRAVENOUS | Status: AC
  Administered 2023-09-24: 250 mL via INTRAVENOUS
  Filled 2023-09-24: qty 250

## 2023-09-24 MED ORDER — TRACE MINERALS CU-MN-SE-ZN 300-55-60-3000 MCG/ML IV SOLN
INTRAVENOUS | Status: AC
  Filled 2023-09-24: qty 1000

## 2023-09-24 MED ORDER — HYDROCODONE-ACETAMINOPHEN 5-325 MG PO TABS
1.0000 | ORAL_TABLET | ORAL | Status: DC | PRN
  Administered 2023-09-25 – 2023-09-26 (×4): 2 via ORAL
  Filled 2023-09-24 (×4): qty 2

## 2023-09-24 MED ORDER — GABAPENTIN 300 MG PO CAPS
300.0000 mg | ORAL_CAPSULE | Freq: Two times a day (BID) | ORAL | Status: DC
  Administered 2023-09-24 – 2023-09-26 (×4): 300 mg via ORAL
  Filled 2023-09-24 (×4): qty 1

## 2023-09-24 NOTE — Progress Notes (Signed)
Subjective:  CC: Mariah Cooper is a 67 y.o. female  Hospital stay day 18, 8 Days Post-Op s/p partial colectomy with colorectal anastomosis.  Complicated by bowel obstruction s/p robotic assisted laparoscopic lysis of adhesion   HPI: No acute issues reported overnight.  Patient reports feeling much better.  Less distention.  Also reports 2 small bowel movements as well.  Continues to pass flatus.  ROS:  General: Denies weight loss, weight gain, fatigue, fevers, chills, and night sweats. Heart: Denies chest pain, palpitations, racing heart, irregular heartbeat, leg pain or swelling, and decreased activity tolerance. Respiratory: Denies breathing difficulty, shortness of breath, wheezing, cough, and sputum. GI: Denies change in appetite, heartburn, nausea, vomiting, constipation, diarrhea, and blood in stool. GU: Denies difficulty urinating, pain with urinating, urgency, frequency, blood in urine.   Objective:   Temp:  [98.1 F (36.7 C)-98.6 F (37 C)] 98.1 F (36.7 C) (11/24 0340) Pulse Rate:  [88-96] 89 (11/24 0800) Resp:  [18] 18 (11/24 0800) BP: (112-135)/(59-69) 135/69 (11/24 0800) SpO2:  [97 %-100 %] 97 % (11/24 0800) Weight:  [65 kg] 65 kg (11/24 0257)     Height: 5' 2.5" (158.8 cm) Weight: 65 kg BMI (Calculated): 25.78   Intake/Output this shift:   Intake/Output Summary (Last 24 hours) at 09/24/2023 0843 Last data filed at 09/24/2023 0700 Gross per 24 hour  Intake 1957.51 ml  Output --  Net 1957.51 ml    Constitutional :  alert, cooperative, appears stated age, and no distress  Respiratory:  clear to auscultation bilaterally  Cardiovascular:  regular rate and rhythm  Gastrointestinal: Soft, nontender, no distention .   Skin: Cool and moist.  Incisions clean dry and intact  Psychiatric: Normal affect, non-agitated, not confused       LABS:     Latest Ref Rng & Units 09/22/2023    5:05 AM 09/21/2023    5:00 AM 09/20/2023    3:54 AM  CMP  Glucose 70 -  99 mg/dL 161  096  045   BUN 8 - 23 mg/dL 23  18  18    Creatinine 0.44 - 1.00 mg/dL 4.09  8.11  9.14   Sodium 135 - 145 mmol/L 134  135  131   Potassium 3.5 - 5.1 mmol/L 3.7  3.7  3.8   Chloride 98 - 111 mmol/L 103  104  104   CO2 22 - 32 mmol/L 22  22  23    Calcium 8.9 - 10.3 mg/dL 9.3  9.3  8.5   Total Protein 6.5 - 8.1 g/dL  7.0    Total Bilirubin <1.2 mg/dL  0.2    Alkaline Phos 38 - 126 U/L  102    AST 15 - 41 U/L  31    ALT 0 - 44 U/L  36        Latest Ref Rng & Units 09/21/2023    5:00 AM 09/17/2023    3:36 AM 09/16/2023    7:52 AM  CBC  WBC 4.0 - 10.5 K/uL 12.1  21.1  11.5   Hemoglobin 12.0 - 15.0 g/dL 78.2  95.6  21.3   Hematocrit 36.0 - 46.0 % 32.9  32.2  31.8   Platelets 150 - 400 K/uL 515  653  630     RADS: N/a Assessment:   s/p partial colectomy with colorectal anastomosis.  Complicated by bowel obstruction s/p robotic assisted laparoscopic lysis of adhesion  Continues to have minimal output from NG.  Tolerating some ice chips.  Reported  bowel movement x 2 now as well as passing flatus for couple days.  Patient also reports improving distention and clinical exam is reassuring.  Will check for residuals and if no concerns, patient is now agreeable to removing NG tube and starting full liquid diet.  labs/images/medications/previous chart entries reviewed personally and relevant changes/updates noted above.

## 2023-09-24 NOTE — Consult Note (Signed)
PHARMACY - TOTAL PARENTERAL NUTRITION CONSULT NOTE   Indication: Prolonged ileus  Patient Measurements: Height: 5' 2.5" (158.8 cm) Weight: 65 kg (143 lb 4.8 oz) IBW/kg (Calculated) : 51.25 TPN AdjBW (KG): 64 Body mass index is 25.79 kg/m. Usual Weight: ~69 kg  Assessment:  67 y.o. female with chronic diverticulitis with stricture and abscess  s/p partial colectomy with colorectal anastomosis done on 09/06/23. Per surgeon, patient has been able to pass gas and had a bowel movement, but unable to advance diet. Stated that although she is very hungry, she's unable to keep anything down. Pharmacy has been consulted for TPN assistance until patient's diet is able to be advanced  Glucose / Insulin: euglycemic while on TPN Electrolytes: wnl Renal: Scr<1, stable Hepatic: AST/ALT wnl Intake / Output;net (+) 5.8L GI Imaging: 10/18 CT abdomen/pelvis:  1. Severe sigmoid colon diverticulosis with long segment of abnormal wall thickening corresponding to the area of diverticular stricture identified on recent barium enema from 07/31/2023. Imaging findings are compatible with severe chronic diverticular disease. No significant surrounding inflammatory changes or free fluid noted at this time. 11/09 repeat CT abdomen/pelvis:  1. Postop change from sigmoid resection and colorectal anastomosis. No specific findings identified to suggest anastomotic dehiscence. 2. Small fluid collection along the right iliac fossa measures 2.8 by 1.1 by 3.1 cm (volume = 5 cc). This may represent a postoperative seroma or hematoma. Abscess is considered less likely.  GI Surgeries / Procedures:  11/06: partial colectomy with colorectal anastomosis (d/t stricture and abscess) 11/16: s/p robotic assisted laparoscopic lysis of adhesion  -on Zosyn  Central access: PICC TPN start date: 09/15/2023  Nutritional Goals: Due to current fluid shortage, goal TPN rate has been modified to 42 mL/hr (provides 80 g of protein and  1169 kcals per day)  Will continue to monitor fluid crisis and adjust goals as appropriate.  RD Assessment: Estimated Needs Total Energy Estimated Needs: 1600-1800kcal/day Total Protein Estimated Needs: 80-90g/day Total Fluid Estimated Needs: 1.6-1.8L/day  Current Nutrition: CLD  Plan:  ---Continue E8/10 TPN at 42 mL/hr ---Plan for 20% fat emulsion(SMOFLIPID) infusion to be ordered 7 days a week  Per discussion with dietary and primary team, although patient has elevated triglycerides, okay to continue current lipids plan. Will continue to monitor and adjust plan if TG>400 ---Electrolytes in TPN: Na 70mEq/L, K 39mEq/L, Ca 33mEq/L, Mg 14mEq/L, and Phos 66mmol/L. Cl:Ac 1:1 ---Continue MVI and trace elements in TPN ---Thiamine 100mg  IV x 5 days(completed) ---Monitor TPN labs until stable, then biweekly on Mon/Thurs  Bettey Costa, PharmD Clinical Pharmacist 09/24/2023 3:05 PM

## 2023-09-24 NOTE — Plan of Care (Signed)

## 2023-09-24 NOTE — Progress Notes (Signed)
RN turned suction on for NG tube. Noted 100 mL output. NG tube pulled by RN per order. No complications noted.

## 2023-09-25 LAB — COMPREHENSIVE METABOLIC PANEL
ALT: 35 U/L (ref 0–44)
AST: 19 U/L (ref 15–41)
Albumin: 3.1 g/dL — ABNORMAL LOW (ref 3.5–5.0)
Alkaline Phosphatase: 103 U/L (ref 38–126)
Anion gap: 8 (ref 5–15)
BUN: 25 mg/dL — ABNORMAL HIGH (ref 8–23)
CO2: 21 mmol/L — ABNORMAL LOW (ref 22–32)
Calcium: 8.9 mg/dL (ref 8.9–10.3)
Chloride: 105 mmol/L (ref 98–111)
Creatinine, Ser: 0.64 mg/dL (ref 0.44–1.00)
GFR, Estimated: >60 mL/min (ref >60)
Glucose, Bld: 105 mg/dL — ABNORMAL HIGH (ref 70–99)
Potassium: 3.7 mmol/L (ref 3.5–5.1)
Sodium: 134 mmol/L — ABNORMAL LOW (ref 135–145)
Total Bilirubin: 0.2 mg/dL (ref <1.2)
Total Protein: 7 g/dL (ref 6.5–8.1)

## 2023-09-25 LAB — TRIGLYCERIDES: Triglycerides: 261 mg/dL — ABNORMAL HIGH (ref <150)

## 2023-09-25 LAB — MAGNESIUM: Magnesium: 2.2 mg/dL (ref 1.7–2.4)

## 2023-09-25 LAB — PHOSPHORUS: Phosphorus: 3 mg/dL (ref 2.5–4.6)

## 2023-09-25 MED ORDER — DOCUSATE SODIUM 100 MG PO CAPS: 100.0000 mg | ORAL_CAPSULE | Freq: Two times a day (BID) | ORAL | 0 refills | Status: AC | PRN

## 2023-09-25 MED ORDER — ADULT MULTIVITAMIN W/MINERALS CH: 1.0000 | ORAL_TABLET | Freq: Every day | ORAL | Status: DC

## 2023-09-25 MED ORDER — PANTOPRAZOLE SODIUM 40 MG PO TBEC
40.0000 mg | DELAYED_RELEASE_TABLET | Freq: Every day | ORAL | Status: DC
  Administered 2023-09-25: 40 mg via ORAL
  Filled 2023-09-25: qty 1

## 2023-09-25 MED ORDER — HYDROCODONE-ACETAMINOPHEN 5-325 MG PO TABS: 1.0000 | ORAL_TABLET | Freq: Four times a day (QID) | ORAL | 0 refills | Status: DC | PRN

## 2023-09-25 MED ORDER — ACETAMINOPHEN 325 MG PO TABS: 650.0000 mg | ORAL_TABLET | Freq: Three times a day (TID) | ORAL | 0 refills | Status: AC | PRN

## 2023-09-25 MED ORDER — ENSURE ENLIVE PO LIQD: 237.0000 mL | Freq: Three times a day (TID) | ORAL | Status: DC

## 2023-09-25 NOTE — Plan of Care (Signed)
  Problem: Activity: Goal: Risk for activity intolerance will decrease Outcome: Progressing   Problem: Nutrition: Goal: Adequate nutrition will be maintained Outcome: Progressing   Problem: Pain Management: Goal: General experience of comfort will improve Outcome: Progressing   Problem: Safety: Goal: Ability to remain free from injury will improve Outcome: Progressing   Problem: Skin Integrity: Goal: Risk for impaired skin integrity will decrease Outcome: Progressing

## 2023-09-25 NOTE — Progress Notes (Signed)
Subjective:  CC: Mariah Cooper is a 67 y.o. female  Hospital stay day 19, 9 Days Post-Op s/p partial colectomy with colorectal anastomosis.  Complicated by bowel obstruction s/p robotic assisted laparoscopic lysis of adhesion   HPI: No acute issues reported overnight.  Feeling better.  Continues to have bowel movements and tolerated full liquid diet  ROS:  General: Denies weight loss, weight gain, fatigue, fevers, chills, and night sweats. Heart: Denies chest pain, palpitations, racing heart, irregular heartbeat, leg pain or swelling, and decreased activity tolerance. Respiratory: Denies breathing difficulty, shortness of breath, wheezing, cough, and sputum. GI: Denies change in appetite, heartburn, nausea, vomiting, constipation, diarrhea, and blood in stool. GU: Denies difficulty urinating, pain with urinating, urgency, frequency, blood in urine.   Objective:   Temp:  [98.4 F (36.9 C)-98.6 F (37 C)] 98.4 F (36.9 C) (11/25 0923) Pulse Rate:  [82-94] 88 (11/25 0923) Resp:  [15-18] 18 (11/25 0923) BP: (108-122)/(65-69) 108/65 (11/25 0923) SpO2:  [96 %-100 %] 96 % (11/25 0923)     Height: 5' 2.5" (158.8 cm) Weight: 65 kg BMI (Calculated): 25.78   Intake/Output this shift:   Intake/Output Summary (Last 24 hours) at 09/25/2023 5409 Last data filed at 09/25/2023 0358 Gross per 24 hour  Intake 525.39 ml  Output --  Net 525.39 ml    Constitutional :  alert, cooperative, appears stated age, and no distress  Respiratory:  clear to auscultation bilaterally  Cardiovascular:  regular rate and rhythm  Gastrointestinal: Soft, nontender, no distention .   Skin: Cool and moist.  Incisions clean dry and intact  Psychiatric: Normal affect, non-agitated, not confused       LABS:     Latest Ref Rng & Units 09/25/2023    6:34 AM 09/22/2023    5:05 AM 09/21/2023    5:00 AM  CMP  Glucose 70 - 99 mg/dL 811  914  782   BUN 8 - 23 mg/dL 25  23  18    Creatinine 0.44 - 1.00  mg/dL 9.56  2.13  0.86   Sodium 135 - 145 mmol/L 134  134  135   Potassium 3.5 - 5.1 mmol/L 3.7  3.7  3.7   Chloride 98 - 111 mmol/L 105  103  104   CO2 22 - 32 mmol/L 21  22  22    Calcium 8.9 - 10.3 mg/dL 8.9  9.3  9.3   Total Protein 6.5 - 8.1 g/dL 7.0   7.0   Total Bilirubin <1.2 mg/dL 0.2   0.2   Alkaline Phos 38 - 126 U/L 103   102   AST 15 - 41 U/L 19   31   ALT 0 - 44 U/L 35   36       Latest Ref Rng & Units 09/21/2023    5:00 AM 09/17/2023    3:36 AM 09/16/2023    7:52 AM  CBC  WBC 4.0 - 10.5 K/uL 12.1  21.1  11.5   Hemoglobin 12.0 - 15.0 g/dL 57.8  46.9  62.9   Hematocrit 36.0 - 46.0 % 32.9  32.2  31.8   Platelets 150 - 400 K/uL 515  653  630     RADS: N/a Assessment:   s/p partial colectomy with colorectal anastomosis.  Complicated by bowel obstruction s/p robotic assisted laparoscopic lysis of adhesion  Tolerating full liquid diet.  Will discontinue TPN and advance to regular diet.  Hopefully DC in a.m.   labs/images/medications/previous chart entries reviewed personally and  relevant changes/updates noted above.

## 2023-09-25 NOTE — Progress Notes (Signed)
Nutrition Follow Up Note   DOCUMENTATION CODES:   Not applicable  INTERVENTION:   Ensure Enlive po TID, each supplement provides 350 kcal and 20 grams of protein.  MVI po daily   Daily weights   NUTRITION DIAGNOSIS:   Inadequate oral intake related to acute illness as evidenced by meal completion < 25%. -ongoing   GOAL:   Patient will meet greater than or equal to 90% of their needs -progressing   MONITOR:   PO intake, Supplement acceptance, Labs, Weight trends, I & O's, Skin  ASSESSMENT:   67 y/o female with h/o hiatal hernia, GERD and complicated diverticulitis with colon stricture and perforation with abscess who is s/p robotic assisted laparoscopic sigmoid colectomy with splenic flexure takedown and cystoscopy 11/6 complicated by post op ileus and SBO requiring robotic assisted laparoscopic lysis of adhesions 11/16.  Pt initiated on a clear liquid diet 11/21; pt advanced to a regular diet today. NGT removed. Plan is to discontinue TPN today. RD will add Ensure supplements to see if patient can tolerate as she reports that some whey products make her sneeze. Pt was not able to tolerate the [facility] as she reports it made her vomit. Refeed labs stable. Pharmacy no longer checking serial blood glucoses. Triglycerides elevated but remain stable. Lipids increased to 7 days a week on 11/21 secondary to noted weight loss. Per chart, pt appears weight stable for the past week. Plan is for possible discharge tomorrow per MD note.    Medications reviewed and include: lovenox, MVI, protonix, zosyn  Labs reviewed: Na 134(L), K 3.7 wnl, BUN 25(H), P 3.0 wnl, Mg 2.2 wnl Triglycerides 261(H) Wbc- 12.1(H), Hgb 11.1(L), Hct 32.9(L) Cbgs- none recent   Diet Order:   Diet Order             Diet regular Room service appropriate? Yes; Fluid consistency: Thin  Diet effective now                  EDUCATION NEEDS:   Education needs have been addressed  Skin:  Skin Assessment:  Reviewed RN Assessment (incision abdomen)  Last BM:  11/24  Height:   Ht Readings from Last 1 Encounters:  09/06/23 5' 2.5" (1.588 m)    Weight:   Wt Readings from Last 1 Encounters:  09/24/23 65 kg    Ideal Body Weight:  52 kg  BMI:  Body mass index is 25.79 kg/m.  Estimated Nutritional Needs:   Kcal:  1600-1800kcal/day  Protein:  80-90g/day  Fluid:  1.6-1.8L/day  Betsey Holiday MS, RD, LDN Please refer to [facility] for RD and/or RD on-call/weekend/after hours pager

## 2023-09-26 NOTE — Progress Notes (Signed)
Patient wheeled to the medical mall exit for discharge.  Mariah Cooper

## 2023-09-26 NOTE — Discharge Summary (Signed)
Physician Discharge Summary  Patient ID: Mariah Cooper MRN: [id number] DOB/AGE: 1956/08/09 67 y.o.  Admit date: 09/06/2023 Discharge date: 09/26/2023  Admission Diagnoses: Diverticulitis with stricture and perforation with abscess  Discharge Diagnoses:  Same as above, small bowel obstruction  Discharged Condition: good  Hospital Course: admitted for above. Underwent elective robotic assisted laparoscopic sigmoid colectomy surgery.  Please see op note for details.  Post op, developed a small bowel obstruction secondary to new adhesions.  Was initially managed conservatively but no improvement so proceeded with robotic assisted laparoscopic lysis of adhesions postop day 10.  Afterwards, slowly recovered with some concerns for ileus but eventually started regaining bowel function was able to tolerate a diet.  Patient received TPN during her recovery process.   At time of d/c, tolerating regular diet, documented multiple bowel movements, and pain controlled.  Consults: None  Discharge Exam: Blood pressure 103/69, pulse 82, temperature 98.1 F (36.7 C), temperature source Oral, resp. rate 16, height 5' 2.5" (1.588 m), weight 65 kg, SpO2 97%. General appearance: alert, cooperative, and no distress GI: soft, non-tender; bowel sounds normal; no masses,  no organomegaly and incision sites clean dry and intact  Disposition:  Discharge disposition: 01-Home or Self Care        Allergies as of 09/26/2023       Reactions   Sulfa Antibiotics Itching   Ivp Dye [iodinated Contrast Media] Rash   Tape Rash   OK TO USE PAPER TAPE        Medication List     STOP taking these medications    metroNIDAZOLE 500 MG tablet Commonly known as: FLAGYL   neomycin 500 MG tablet Commonly known as: MYCIFRADIN       TAKE these medications    acetaminophen 325 MG tablet Commonly known as: Tylenol Take 2 tablets (650 mg total) by mouth every 8 (eight) hours as needed for mild pain  (pain score 1-3).   calcium carbonate 1500 (600 Ca) MG Tabs tablet Commonly known as: OSCAL Take 600 mg of elemental calcium by mouth daily with breakfast.   docusate sodium 100 MG capsule Commonly known as: Colace Take 1 capsule (100 mg total) by mouth 2 (two) times daily as needed for up to 10 days for mild constipation.   HYDROcodone-acetaminophen 5-325 MG tablet Commonly known as: Norco Take 1 tablet by mouth every 6 (six) hours as needed for up to 6 doses for moderate pain (pain score 4-6).   ibuprofen 200 MG tablet Commonly known as: ADVIL Take 400 mg by mouth every 6 (six) hours as needed for mild pain (pain score 1-3) or moderate pain (pain score 4-6).   Magnesium 100 MG Tabs Take 250 mg by mouth daily.   multivitamin with minerals Tabs tablet Take 1 tablet by mouth daily. Centrum silver 50+   POLYETHYLENE GLYCOL 3350 PO Take 1 Capful by mouth daily as needed for moderate constipation or mild constipation.   Vitamin D 50 MCG (2000 UT) tablet Take 2,000 Units by mouth daily.        Follow-up Information     [place], Jaila Schellhorn, DO. Go on 10/10/2023.   Specialties: General Surgery, Surgery Why: post op surgery. go AT 9:30AM. Contact information: [address] Felicita Gage [place] [address] [phone]                  Total time spent arranging discharge was >36min. Signed: Sung Amabile 09/26/2023, 9:27 AM

## 2023-09-26 NOTE — Progress Notes (Signed)
Discharge education completed. PICC removed without complications. Patient assisted with getting dressed by family. Being discharged to the care of her sister in stable condition after 15 min observation following PICC removed.

## 2023-09-26 NOTE — Plan of Care (Signed)
Adequate for discharge  Mariah Cooper Mariah Cooper  

## 2024-01-02 ENCOUNTER — Other Ambulatory Visit: Payer: Self-pay | Admitting: Internal Medicine

## 2024-01-02 DIAGNOSIS — Z1231 Encounter for screening mammogram for malignant neoplasm of breast: Secondary | ICD-10-CM

## 2024-01-30 ENCOUNTER — Ambulatory Visit
Admission: RE | Admit: 2024-01-30 | Discharge: 2024-01-30 | Disposition: A | Discharge status: Home / Self Care | Source: Ambulatory Visit | Attending: Internal Medicine | Admitting: Internal Medicine

## 2024-01-30 DIAGNOSIS — Z1231 Encounter for screening mammogram for malignant neoplasm of breast: Secondary | ICD-10-CM | POA: Diagnosis present

## 2024-04-02 ENCOUNTER — Encounter: Payer: Self-pay | Admitting: Ophthalmology

## 2024-04-05 ENCOUNTER — Encounter: Payer: Self-pay | Admitting: Ophthalmology

## 2024-04-05 NOTE — Anesthesia Preprocedure Evaluation (Addendum)
 Anesthesia Evaluation  Patient identified by MRN, date of birth, ID band Patient awake    Reviewed: Allergy & Precautions, H&P , NPO status , Patient's Chart, lab work & pertinent test results  History of Anesthesia Complications (+) history of anesthetic complications  Airway Mallampati: II  TM Distance: <3 FB Neck ROM: Full    Dental no notable dental hx.    Pulmonary sleep apnea , former smoker   Pulmonary exam normal breath sounds clear to auscultation       Cardiovascular hypertension, Normal cardiovascular exam Rhythm:Regular Rate:Normal     Neuro/Psych negative neurological ROS  negative psych ROS   GI/Hepatic negative GI ROS, Neg liver ROS, hiatal hernia,GERD  ,,  Endo/Other  negative endocrine ROS    Renal/GU negative Renal ROS  negative genitourinary   Musculoskeletal negative musculoskeletal ROS (+) Arthritis ,    Abdominal   Peds negative pediatric ROS (+)  Hematology negative hematology ROS (+) Blood dyscrasia, anemia   Anesthesia Other Findings Medical History  History of hiatal hernia  Anemia GERD (gastroesophageal reflux disease)  Complication of anesthesia--hard time waking up, does not want general anesthesia, so that's an easy request! Stricture of sigmoid colon [facility])  Thoracic aortic  atherosclerosis (HCC) Arthritis  Motion sickness Hypertension  Anemia Sleep apnea is noted on Epic; patient denies any history of sleep apnea Thoroughly explained MAC anesthesia and care during cataract surgery, patient expressed her understanding, and was happy with this.     Reproductive/Obstetrics negative OB ROS                             Anesthesia Physical Anesthesia Plan  ASA: 3  Anesthesia Plan: MAC   Post-op Pain Management:    Induction: Intravenous  PONV Risk Score and Plan:   Airway Management Planned: Natural Airway and Nasal Cannula  Additional  Equipment:   Intra-op Plan:   Post-operative Plan:   Informed Consent: I have reviewed the patients History and Physical, chart, labs and discussed the procedure including the risks, benefits and alternatives for the proposed anesthesia with the patient or authorized representative who has indicated his/her understanding and acceptance.     Dental Advisory Given  Plan Discussed with: Anesthesiologist, CRNA and Surgeon  Anesthesia Plan Comments: (Patient consented for risks of anesthesia including but not limited to:  - adverse reactions to medications - damage to eyes, teeth, lips or other oral mucosa - nerve damage due to positioning  - sore throat or hoarseness - Damage to heart, brain, nerves, lungs, other parts of body or loss of life  Patient voiced understanding and assent.)        Anesthesia Quick Evaluation

## 2024-04-09 NOTE — Discharge Instructions (Signed)

## 2024-04-10 ENCOUNTER — Ambulatory Visit: Payer: Self-pay | Admitting: Anesthesiology

## 2024-04-10 ENCOUNTER — Encounter: Payer: Self-pay | Admitting: Ophthalmology

## 2024-04-10 ENCOUNTER — Ambulatory Visit
Admission: RE | Admit: 2024-04-10 | Discharge: 2024-04-10 | Disposition: A | Discharge status: Home / Self Care | Attending: Ophthalmology | Admitting: Ophthalmology

## 2024-04-10 ENCOUNTER — Encounter
Admission: RE | Disposition: A | Discharge status: Home / Self Care | Payer: Self-pay | Source: Home / Self Care | Attending: Ophthalmology

## 2024-04-10 ENCOUNTER — Other Ambulatory Visit: Payer: Self-pay

## 2024-04-10 DIAGNOSIS — H5703 Miosis: Secondary | ICD-10-CM | POA: Diagnosis not present

## 2024-04-10 DIAGNOSIS — Z87891 Personal history of nicotine dependence: Secondary | ICD-10-CM | POA: Diagnosis not present

## 2024-04-10 DIAGNOSIS — G4730 Sleep apnea, unspecified: Secondary | ICD-10-CM | POA: Diagnosis not present

## 2024-04-10 DIAGNOSIS — H2511 Age-related nuclear cataract, right eye: Secondary | ICD-10-CM | POA: Diagnosis present

## 2024-04-10 DIAGNOSIS — I10 Essential (primary) hypertension: Secondary | ICD-10-CM | POA: Diagnosis not present

## 2024-04-10 HISTORY — DX: Essential (primary) hypertension: I10

## 2024-04-10 HISTORY — PX: CATARACT EXTRACTION W/PHACO: SHX586

## 2024-04-10 HISTORY — DX: Motion sickness, initial encounter: T75.3XXA

## 2024-04-10 HISTORY — DX: Sleep apnea, unspecified: G47.30

## 2024-04-10 SURGERY — PHACOEMULSIFICATION, CATARACT, WITH IOL INSERTION
Anesthesia: Monitor Anesthesia Care | Laterality: Right

## 2024-04-10 MED ORDER — SIGHTPATH DOSE#1 BSS IO SOLN
INTRAOCULAR | Status: DC | PRN
  Administered 2024-04-10: 15 mL via INTRAOCULAR

## 2024-04-10 MED ORDER — CEFUROXIME OPHTHALMIC INJECTION 1 MG/0.1 ML
INJECTION | OPHTHALMIC | Status: DC | PRN
  Administered 2024-04-10: .1 mL via INTRACAMERAL

## 2024-04-10 MED ORDER — ARMC OPHTHALMIC DILATING DROPS
OPHTHALMIC | Status: AC
  Filled 2024-04-10: qty 0.5

## 2024-04-10 MED ORDER — TETRACAINE HCL 0.5 % OP SOLN
OPHTHALMIC | Status: AC
  Filled 2024-04-10: qty 4

## 2024-04-10 MED ORDER — SIGHTPATH DOSE#1 NA HYALUR & NA CHOND-NA HYALUR IO KIT
PACK | INTRAOCULAR | Status: DC | PRN
  Administered 2024-04-10: 1 via OPHTHALMIC

## 2024-04-10 MED ORDER — LIDOCAINE HCL (PF) 2 % IJ SOLN
INTRAMUSCULAR | Status: DC | PRN
  Administered 2024-04-10: 1 mL

## 2024-04-10 MED ORDER — MIDAZOLAM HCL 2 MG/2ML IJ SOLN
INTRAMUSCULAR | Status: AC
Start: 2024-04-10 — End: 2024-04-10
  Filled 2024-04-10: qty 2

## 2024-04-10 MED ORDER — FENTANYL CITRATE (PF) 100 MCG/2ML IJ SOLN
INTRAMUSCULAR | Status: AC
  Filled 2024-04-10: qty 2

## 2024-04-10 MED ORDER — EPINEPHRINE PF 1 MG/ML IJ SOLN
INTRAMUSCULAR | Status: DC | PRN
  Administered 2024-04-10: 51 mL via OPHTHALMIC

## 2024-04-10 MED ORDER — BRIMONIDINE TARTRATE-TIMOLOL 0.2-0.5 % OP SOLN
OPHTHALMIC | Status: DC | PRN
  Administered 2024-04-10: 1 [drp] via OPHTHALMIC

## 2024-04-10 MED ORDER — MIDAZOLAM HCL 2 MG/2ML IJ SOLN
INTRAMUSCULAR | Status: DC | PRN
  Administered 2024-04-10: 1 mg via INTRAVENOUS

## 2024-04-10 MED ORDER — FENTANYL CITRATE (PF) 100 MCG/2ML IJ SOLN
INTRAMUSCULAR | Status: DC | PRN
  Administered 2024-04-10: 50 ug via INTRAVENOUS

## 2024-04-10 MED ORDER — TETRACAINE HCL 0.5 % OP SOLN
1.0000 [drp] | OPHTHALMIC | Status: DC | PRN
  Administered 2024-04-10 (×3): 1 [drp] via OPHTHALMIC

## 2024-04-10 MED ORDER — ARMC OPHTHALMIC DILATING DROPS
1.0000 | OPHTHALMIC | Status: DC | PRN
  Administered 2024-04-10 (×3): 1 via OPHTHALMIC

## 2024-04-10 SURGICAL SUPPLY — 11 items
CATARACT SUITE SIGHTPATH (MISCELLANEOUS) ×1 IMPLANT
FEE CATARACT SUITE SIGHTPATH (MISCELLANEOUS) ×1 IMPLANT
GLOVE BIOGEL PI IND STRL 8 (GLOVE) ×1 IMPLANT
GLOVE SURG LX STRL 7.5 STRW (GLOVE) ×1 IMPLANT
GLOVE SURG PROTEXIS BL SZ6.5 (GLOVE) ×1 IMPLANT
GLOVE SURG SYN 6.5 PF PI BL (GLOVE) ×1 IMPLANT
LENS IOL EYHANCE TRC 225 22.5 IMPLANT
NDL FILTER BLUNT 18X1 1/2 (NEEDLE) ×1 IMPLANT
NEEDLE FILTER BLUNT 18X1 1/2 (NEEDLE) ×1 IMPLANT
RING MALYGIN 7.0 (MISCELLANEOUS) IMPLANT
SYR 3ML LL SCALE MARK (SYRINGE) ×1 IMPLANT

## 2024-04-10 NOTE — Anesthesia Postprocedure Evaluation (Signed)
 Anesthesia Post Note  Patient: SARAANN ENNEKING  Procedure(s) Performed: PHACOEMULSIFICATION, CATARACT, WITH IOL INSERTION 3.56, 00:28.7 (Right)  Patient location during evaluation: PACU Anesthesia Type: MAC Level of consciousness: awake and alert Pain management: pain level controlled Vital Signs Assessment: post-procedure vital signs reviewed and stable Respiratory status: spontaneous breathing, nonlabored ventilation, respiratory function stable and patient connected to nasal cannula oxygen Cardiovascular status: stable and blood pressure returned to baseline Postop Assessment: no apparent nausea or vomiting Anesthetic complications: no   No notable events documented.   Last Vitals:  Vitals:   04/10/24 1150 04/10/24 1155  BP: (!) 145/59 (!) 154/67  Pulse: 70 69  Resp: 15 17  Temp: (!) 36.4 C 36.6 C  SpO2: 95% 96%    Last Pain:  Vitals:   04/10/24 1155  TempSrc:   PainSc: 0-No pain                 Danniel Tones C Jarrad Mclees

## 2024-04-10 NOTE — Transfer of Care (Signed)
 Immediate Anesthesia Transfer of Care Note  Patient: Mariah Cooper  Procedure(s) Performed: PHACOEMULSIFICATION, CATARACT, WITH IOL INSERTION 3.56, 00:28.7 (Right)  Patient Location: PACU  Anesthesia Type: MAC  Level of Consciousness: awake, alert  and patient cooperative  Airway and Oxygen Therapy: Patient Spontanous Breathing and Patient connected to supplemental oxygen  Post-op Assessment: Post-op Vital signs reviewed, Patient's Cardiovascular Status Stable, Respiratory Function Stable, Patent Airway and No signs of Nausea or vomiting  Post-op Vital Signs: Reviewed and stable  Complications: No notable events documented.

## 2024-04-10 NOTE — Op Note (Signed)
 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  H25.11   POSTOPERATIVE DIAGNOSIS:  same   PROCEDURE:  Phacoemulsification with Toric posterior chamber intraocular lens placement of the right eye.  Ultrasound time: Procedure(s): PHACOEMULSIFICATION, CATARACT, WITH IOL INSERTION 3.56, 00:28.7 (Right)  LENS:   Implant Name Type Inv. Item Serial No. Manufacturer Lot No. LRB No. Used Action  ENHANCE TORIC II IOL Intraocular Lens  [id number] [employer]  Right 1 Implanted     [id number] 22.5D Toric intraocular lens with 2.25 diopters of cylindrical power with axis orientation at 12 degrees.   SURGEON:  Berline Brenner, MD   ANESTHESIA: Topical with tetracaine drops and 2% Xylocaine  jelly, augmented with 1% preservative-free intracameral lidocaine . .   COMPLICATIONS:  None.   DESCRIPTION OF PROCEDURE:  The patient was identified in the holding room and transported to the operating suite and placed in the supine position under the operating microscope.  The right eye was identified as the operative eye, and it was prepped and draped in the usual sterile ophthalmic fashion.    A clear-corneal paracentesis incision was made at the 12:00 position.  0.5 ml of preservative-free 1% lidocaine  was injected into the anterior chamber. The anterior chamber was filled with Viscoat. A 7mm Malyugin ring was placed. A 2.4 millimeter near clear corneal incision was then made at the 9:00 position.  A cystotome and capsulorrhexis forceps were then used to make a curvilinear capsulorrhexis.  Hydrodissection and hydrodelineation were then performed using balanced salt solution.   Phacoemulsification was then used in stop and chop fashion to remove the lens, nucleus and epinucleus.  The remaining cortex was aspirated using the irrigation and aspiration handpiece.  Provisc viscoelastic was then placed into the capsular bag to distend  it for lens placement.    A Toric lens was then injected into the capsular bag.  It was rotated clockwise until the axis marks on the lens were approximately 15 degrees in the counterclockwise direction to the intended alignment. The Malyugin ring was removed. The viscoelastic was aspirated from the eye using the irrigation aspiration handpiece.  Then, a Koch spatula through the sideport incision was used to rotate the lens in a clockwise direction until the axis markings of the intraocular lens were lined up with the Verion alignment.  Balanced salt solution was then used to hydrate the wounds. Cefuroxime 0.1 ml of a 10mg /ml solution was injected into the anterior chamber for a dose of 1 mg of intracameral antibiotic at the completion of the case.    The eye was noted to have a physiologic pressure and there was no wound leak noted.   Timolol and Brimonidine drops were applied to the eye.  The patient was taken to the recovery room in stable condition having had no complications of anesthesia or surgery.  Dshaun Reppucci 04/10/2024, 11:49 AM

## 2024-04-10 NOTE — H&P (Signed)
 Mariah Cooper   Primary Care Physician:  Little Riff, MD Ophthalmologist: Dr. Annell Kidney  Pre-Procedure History & Physical: HPI:  Mariah Cooper is a 68 y.o. female here for ophthalmic surgery.   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   Thoracic aortic atherosclerosis (HCC) 2022    Past Surgical History:  Procedure Laterality Date   BREAST CYST ASPIRATION Right 01/15/2018   Neg   CHOLECYSTECTOMY     COLONOSCOPY N/A 07/14/2023   Procedure: COLONOSCOPY;  Surgeon: Quintin Buckle, DO;  Location: [facility] ENDOSCOPY;  Service: Gastroenterology;  Laterality: N/A;   ECTOPIC PREGNANCY SURGERY     INCONTINENCE SURGERY     mesh sling- 2013   LAPAROSCOPIC HYSTERECTOMY     OPEN REDUCTION INTERNAL FIXATION (ORIF) DISTAL RADIAL FRACTURE Left 08/31/2015   Procedure: OPEN REDUCTION INTERNAL FIXATION (ORIF) DISTAL RADIAL FRACTURE;  Surgeon: Marlynn Singer, MD;  Location: [facility] ORS;  Service: Orthopedics;  Laterality: Left;   POLYPECTOMY  07/14/2023   Procedure: POLYPECTOMY;  Surgeon: Quintin Buckle, DO;  Location: [facility] ENDOSCOPY;  Service: Gastroenterology;;   ROBOTIC ASSISTED LAPAROSCOPIC LYSIS OF ADHESION  09/16/2023   Procedure: XI ROBOTIC ASSISTED LAPAROSCOPIC LYSIS OF ADHESION;  Surgeon: Eldred Grego, MD;  Location: [facility] ORS;  Service: General;;   SUBMUCOSAL TATTOO INJECTION  07/14/2023   Procedure: SUBMUCOSAL TATTOO INJECTION;  Surgeon: Quintin Buckle, DO;  Location: [facility] ENDOSCOPY;  Service: Gastroenterology;;  at 25cm in colon   VAGINAL PROLAPSE REPAIR     bladder tack after hysterectomy   XI ROBOTIC ASSISTED SMALL  BOWEL RESECTION N/A 09/16/2023   Procedure: XI ROBOTIC ASSISTED DIAGNOSTIC LAPAROSCOPY;  Surgeon: Eldred Grego, MD;  Location: [facility] ORS;  Service: General;  Laterality: N/A;    Prior to Admission medications   Medication Sig Start Date End Date Taking? Authorizing Provider  calcium carbonate (OSCAL) 1500 (600 Ca) MG TABS tablet Take 600 mg of elemental calcium by mouth daily with breakfast.   Yes [provider]  Cholecalciferol (VITAMIN D) 50 MCG (2000 UT) tablet Take 2,000 Units by mouth daily.   Yes [provider]  ibuprofen (ADVIL) 200 MG tablet Take 400 mg by mouth every 6 (six) hours as needed for mild pain (pain score 1-3) or moderate pain (pain score 4-6).   Yes [provider]  Magnesium 100 MG TABS Take 250 mg by mouth daily.   Yes [provider]  Multiple Vitamin (MULTIVITAMIN WITH MINERALS) TABS tablet Take 1 tablet by mouth daily. Centrum silver 50+   Yes [provider]  HYDROcodone -acetaminophen  (NORCO) 5-325 MG tablet Take 1 tablet by mouth every 6 (six) hours as needed for up to 6 doses for moderate pain (pain score 4-6). Patient not taking: Reported on 04/02/2024 09/25/23   Sakai, Isami, DO  POLYETHYLENE GLYCOL 3350 PO Take 1 Capful by mouth daily as needed for moderate constipation or mild constipation. Patient not taking: Reported on 04/02/2024    [provider]    Allergies as of 03/04/2024 - Review Complete 09/09/2023  Allergen Reaction Noted   Sulfa antibiotics Itching 08/25/2015  Ivp dye [iodinated contrast media] Rash 08/25/2015   Tape Rash 08/28/2015    Family History  Problem Relation Age of Onset   Hyperlipidemia Mother    Hypertension Mother    Hypertension Father    Breast cancer Neg Hx     Social History   Socioeconomic History   Marital status: Widowed    Spouse name: Not on file   Number of children: Not on file   Years of education: Not on file   Highest education level: Not on file   Occupational History   Not on file  Tobacco Use   Smoking status: Former    Current packs/day: 0.00    Average packs/day: 1 pack/day for 42.0 years (42.0 ttl pk-yrs)    Types: Cigarettes    Start date: 07/28/1973    Quit date: 07/29/2015    Years since quitting: 8.7   Smokeless tobacco: Never  Vaping Use   Vaping status: Never Used  Substance and Sexual Activity   Alcohol use: No   Drug use: No   Sexual activity: Not Currently  Other Topics Concern   Not on file  Social History Narrative   Lives alone   Social Drivers of Health   Financial Resource Strain: Not on file  Food Insecurity: No Food Insecurity (09/06/2023)   Hunger Vital Sign    Worried About Running Out of Food in the Last Year: Never true    Ran Out of Food in the Last Year: Never true  Transportation Needs: No Transportation Needs (09/06/2023)   PRAPARE - Administrator, Civil Service (Medical): No    Lack of Transportation (Non-Medical): No  Physical Activity: Not on file  Stress: Not on file  Social Connections: Not on file  Intimate Partner Violence: Not At Risk (09/06/2023)   Humiliation, Afraid, Rape, and Kick questionnaire    Fear of Current or Ex-Partner: No    Emotionally Abused: No    Physically Abused: No    Sexually Abused: No    Review of Systems: See HPI, otherwise negative ROS  Physical Exam: BP (!) 161/63   Pulse 71   Temp (!) 97.5 F (36.4 C) (Temporal)   Resp 19   Ht 5' 3 (1.6 m)   Wt 65.3 kg   SpO2 97%   BMI 25.51 kg/m  General:   Alert,  pleasant and cooperative in NAD Head:  Normocephalic and atraumatic. Lungs:  Clear to auscultation.    Heart:  Regular rate and rhythm.   Impression/Plan: Mariah Cooper is here for ophthalmic surgery.  Risks, benefits, limitations, and alternatives regarding ophthalmic surgery have been reviewed with the patient.  Questions have been answered.  All parties agreeable.   Annell Kidney, MD  04/10/2024, 10:46  AM

## 2024-04-11 ENCOUNTER — Encounter: Payer: Self-pay | Admitting: Ophthalmology

## 2024-04-17 ENCOUNTER — Encounter: Payer: Self-pay | Admitting: Ophthalmology

## 2024-04-23 NOTE — Discharge Instructions (Signed)

## 2024-04-24 ENCOUNTER — Other Ambulatory Visit: Payer: Self-pay

## 2024-04-24 ENCOUNTER — Ambulatory Visit: Admitting: Anesthesiology

## 2024-04-24 ENCOUNTER — Ambulatory Visit
Admission: RE | Admit: 2024-04-24 | Discharge: 2024-04-24 | Disposition: A | Discharge status: Home / Self Care | Attending: Ophthalmology | Admitting: Ophthalmology

## 2024-04-24 ENCOUNTER — Encounter: Payer: Self-pay | Admitting: Ophthalmology

## 2024-04-24 ENCOUNTER — Encounter
Admission: RE | Disposition: A | Discharge status: Home / Self Care | Payer: Self-pay | Source: Home / Self Care | Attending: Ophthalmology

## 2024-04-24 DIAGNOSIS — K219 Gastro-esophageal reflux disease without esophagitis: Secondary | ICD-10-CM | POA: Diagnosis not present

## 2024-04-24 DIAGNOSIS — Z87891 Personal history of nicotine dependence: Secondary | ICD-10-CM | POA: Insufficient documentation

## 2024-04-24 DIAGNOSIS — H5703 Miosis: Secondary | ICD-10-CM | POA: Insufficient documentation

## 2024-04-24 DIAGNOSIS — H2512 Age-related nuclear cataract, left eye: Secondary | ICD-10-CM | POA: Diagnosis present

## 2024-04-24 DIAGNOSIS — G4730 Sleep apnea, unspecified: Secondary | ICD-10-CM | POA: Diagnosis not present

## 2024-04-24 DIAGNOSIS — I10 Essential (primary) hypertension: Secondary | ICD-10-CM | POA: Diagnosis not present

## 2024-04-24 DIAGNOSIS — M1990 Unspecified osteoarthritis, unspecified site: Secondary | ICD-10-CM | POA: Diagnosis not present

## 2024-04-24 DIAGNOSIS — K449 Diaphragmatic hernia without obstruction or gangrene: Secondary | ICD-10-CM | POA: Diagnosis not present

## 2024-04-24 DIAGNOSIS — D649 Anemia, unspecified: Secondary | ICD-10-CM | POA: Insufficient documentation

## 2024-04-24 HISTORY — PX: CATARACT EXTRACTION W/PHACO: SHX586

## 2024-04-24 SURGERY — PHACOEMULSIFICATION, CATARACT, WITH IOL INSERTION
Anesthesia: Monitor Anesthesia Care | Site: Eye | Laterality: Left

## 2024-04-24 MED ORDER — ARMC OPHTHALMIC DILATING DROPS
OPHTHALMIC | Status: AC
  Filled 2024-04-24: qty 0.5

## 2024-04-24 MED ORDER — FENTANYL CITRATE (PF) 100 MCG/2ML IJ SOLN
INTRAMUSCULAR | Status: DC | PRN
  Administered 2024-04-24: 50 ug via INTRAVENOUS

## 2024-04-24 MED ORDER — BRIMONIDINE TARTRATE-TIMOLOL 0.2-0.5 % OP SOLN
OPHTHALMIC | Status: DC | PRN
  Administered 2024-04-24: 1 [drp] via OPHTHALMIC

## 2024-04-24 MED ORDER — SIGHTPATH DOSE#1 NA HYALUR & NA CHOND-NA HYALUR IO KIT
PACK | INTRAOCULAR | Status: DC | PRN
  Administered 2024-04-24: 1 via OPHTHALMIC

## 2024-04-24 MED ORDER — TETRACAINE HCL 0.5 % OP SOLN
1.0000 [drp] | OPHTHALMIC | Status: DC | PRN
  Administered 2024-04-24 (×3): 1 [drp] via OPHTHALMIC

## 2024-04-24 MED ORDER — TETRACAINE HCL 0.5 % OP SOLN
OPHTHALMIC | Status: AC
  Filled 2024-04-24: qty 4

## 2024-04-24 MED ORDER — FENTANYL CITRATE (PF) 100 MCG/2ML IJ SOLN
INTRAMUSCULAR | Status: AC
  Filled 2024-04-24: qty 2

## 2024-04-24 MED ORDER — CEFUROXIME OPHTHALMIC INJECTION 1 MG/0.1 ML
INJECTION | OPHTHALMIC | Status: DC | PRN
  Administered 2024-04-24: 1 mg via INTRACAMERAL

## 2024-04-24 MED ORDER — SIGHTPATH DOSE#1 BSS IO SOLN
INTRAOCULAR | Status: DC | PRN
Start: 2024-04-24 — End: 2024-04-24
  Administered 2024-04-24: 15 mL via INTRAOCULAR

## 2024-04-24 MED ORDER — SODIUM CHLORIDE 0.9% FLUSH
INTRAVENOUS | Status: DC | PRN
  Administered 2024-04-24: 10 mL via INTRAVENOUS

## 2024-04-24 MED ORDER — SIGHTPATH DOSE#1 BSS IO SOLN
INTRAOCULAR | Status: DC | PRN
  Administered 2024-04-24: 58 mL via OPHTHALMIC

## 2024-04-24 MED ORDER — MIDAZOLAM HCL 2 MG/2ML IJ SOLN
INTRAMUSCULAR | Status: DC | PRN
Start: 2024-04-24 — End: 2024-04-24
  Administered 2024-04-24: 1.5 mg via INTRAVENOUS

## 2024-04-24 MED ORDER — MIDAZOLAM HCL 2 MG/2ML IJ SOLN
INTRAMUSCULAR | Status: AC
  Filled 2024-04-24: qty 2

## 2024-04-24 MED ORDER — LIDOCAINE HCL (PF) 2 % IJ SOLN
INTRAOCULAR | Status: DC | PRN
  Administered 2024-04-24: 2 mL

## 2024-04-24 MED ORDER — ARMC OPHTHALMIC DILATING DROPS
1.0000 | OPHTHALMIC | Status: DC | PRN
  Administered 2024-04-24 (×3): 1 via OPHTHALMIC

## 2024-04-24 SURGICAL SUPPLY — 11 items
CATARACT SUITE SIGHTPATH (MISCELLANEOUS) ×1 IMPLANT
FEE CATARACT SUITE SIGHTPATH (MISCELLANEOUS) ×1 IMPLANT
GLOVE BIOGEL PI IND STRL 8 (GLOVE) ×1 IMPLANT
GLOVE SURG LX STRL 7.5 STRW (GLOVE) ×1 IMPLANT
GLOVE SURG PROTEXIS BL SZ6.5 (GLOVE) ×1 IMPLANT
GLOVE SURG SYN 6.5 PF PI BL (GLOVE) ×1 IMPLANT
LENS IOL EYHANCE TRC 150 19.5 IMPLANT
NDL FILTER BLUNT 18X1 1/2 (NEEDLE) ×1 IMPLANT
NEEDLE FILTER BLUNT 18X1 1/2 (NEEDLE) ×1 IMPLANT
RING MALYGIN 7.0 (MISCELLANEOUS) IMPLANT
SYR 3ML LL SCALE MARK (SYRINGE) ×1 IMPLANT

## 2024-04-24 NOTE — Anesthesia Postprocedure Evaluation (Signed)
 Anesthesia Post Note  Patient: ADISON REIFSTECK  Procedure(s) Performed: PHACOEMULSIFICATION, CATARACT, WITH IOL INSERTION 5.64 00:32.6 (Left: Eye)  Patient location during evaluation: PACU Anesthesia Type: MAC Level of consciousness: awake and alert Pain management: pain level controlled Vital Signs Assessment: post-procedure vital signs reviewed and stable Respiratory status: spontaneous breathing, nonlabored ventilation, respiratory function stable and patient connected to nasal cannula oxygen Cardiovascular status: stable and blood pressure returned to baseline Postop Assessment: no apparent nausea or vomiting Anesthetic complications: no   No notable events documented.   Last Vitals:  Vitals:   04/24/24 1116 04/24/24 1119  BP: (!) 148/57 (!) 151/60  Pulse: 69 62  Resp: 16 17  Temp: (!) 36.1 C (!) 36.1 C  SpO2: 100% 98%    Last Pain:  Vitals:   04/24/24 1119  TempSrc:   PainSc: 0-No pain                 Lendia LITTIE Mae

## 2024-04-24 NOTE — Transfer of Care (Signed)
 Immediate Anesthesia Transfer of Care Note  Patient: Mariah Cooper  Procedure(s) Performed: PHACOEMULSIFICATION, CATARACT, WITH IOL INSERTION 5.64 00:32.6 (Left: Eye)  Patient Location: PACU  Anesthesia Type:MAC  Level of Consciousness: awake, alert , and oriented  Airway & Oxygen Therapy: Patient Spontanous Breathing  Post-op Assessment: Report given to RN and Post -op Vital signs reviewed and stable  Post vital signs: Reviewed and stable  Last Vitals:  Vitals Value Taken Time  BP 148/57 04/24/24 11:15  Temp 36.1 C 04/24/24 11:16  Pulse 67 04/24/24 11:17  Resp 15 04/24/24 11:17  SpO2 100 % 04/24/24 11:17  Vitals shown include unfiled device data.  Last Pain:  Vitals:   04/24/24 1116  TempSrc:   PainSc: 0-No pain         Complications: No notable events documented.

## 2024-04-24 NOTE — Op Note (Signed)
 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   PROCEDURE:  Phacoemulsification with Toric posterior chamber intraocular lens placement of the left eye with Malyugin ring  Ultrasound time: Procedure(s): PHACOEMULSIFICATION, CATARACT, WITH IOL INSERTION 5.64 00:32.6 (Left)  LENS:   Implant Name Type Inv. Item Serial No. Manufacturer Lot No. LRB No. Used Action  LENS IOL EYHANCE TRC 150 19.5 - [id number]  LENS IOL EYHANCE TRC 150 19.5 [id number] [employer]  Left 1 Implanted     [id number] Toric intraocular lens with 1.5 diopters of cylindrical power with axis orientation at 121 degrees.     SURGEON:  Dene FABIENE Etienne, MD   ANESTHESIA:  Topical with tetracaine  drops and 2% Xylocaine  jelly, augmented with 1% preservative-free intracameral lidocaine .  COMPLICATIONS:  None.   DESCRIPTION OF PROCEDURE:  The patient was identified in the holding room and transported to the operating suite and placed in the supine position under the operating microscope.  The left eye was identified as the operative eye, and it was prepped and draped in the usual sterile ophthalmic fashion.    A clear-corneal paracentesis incision was made at the 1:30 position.  0.5 ml of preservative-free 1% lidocaine  was injected into the anterior chamber. The anterior chamber was filled with Viscoat.  A 2.4 millimeter near clear corneal incision was then made at the 10:30 position. A Malyugin ring was placed. A cystotome and capsulorrhexis forceps were then used to make a curvilinear capsulorrhexis.  Hydrodissection and hydrodelineation were then performed using balanced salt  solution.   Phacoemulsification was then used in stop and chop fashion to remove the lens, nucleus and epinucleus.  The remaining cortex was aspirated using the irrigation and  aspiration handpiece.  Provisc viscoelastic was then placed into the capsular bag to distend it for lens placement.  The Verion digital marker was used to align the implant at the intended axis.   A Toric lens was then injected into the capsular bag.  It was rotated clockwise until the axis marks on the lens were approximately 15 degrees in the counterclockwise direction to the intended alignment. The Malyugin ring was removed. The viscoelastic was aspirated from the eye using the irrigation aspiration handpiece.  Then, a Koch spatula through the sideport incision was used to rotate the lens in a clockwise direction until the axis markings of the intraocular lens were lined up with the Verion alignment.  Balanced salt  solution was then used to hydrate the wounds. Cefuroxime  0.1 ml of a 10mg /ml solution was injected into the anterior chamber for a dose of 1 mg of intracameral antibiotic at the completion of the case.    The eye was noted to have a physiologic pressure and there was no wound leak noted.   Timolol  and Brimonidine  drops were applied to the eye.  The patient was taken to the recovery room in stable condition having had no complications of anesthesia or surgery.  Donaldo Teegarden 04/24/2024, 11:14 AM

## 2024-04-24 NOTE — Anesthesia Preprocedure Evaluation (Addendum)
 Anesthesia Evaluation  Patient identified by MRN, date of birth, ID band Patient awake    Reviewed: Allergy & Precautions, H&P , NPO status , Patient's Chart, lab work & pertinent test results  History of Anesthesia Complications (+) history of anesthetic complications  Airway Mallampati: II  TM Distance: <3 FB Neck ROM: Full    Dental no notable dental hx.    Pulmonary sleep apnea , former smoker   Pulmonary exam normal breath sounds clear to auscultation       Cardiovascular hypertension, Normal cardiovascular exam Rhythm:Regular Rate:Normal     Neuro/Psych negative neurological ROS  negative psych ROS   GI/Hepatic negative GI ROS, Neg liver ROS, hiatal hernia,GERD  ,,  Endo/Other  negative endocrine ROS    Renal/GU negative Renal ROS  negative genitourinary   Musculoskeletal negative musculoskeletal ROS (+) Arthritis ,    Abdominal   Peds negative pediatric ROS (+)  Hematology negative hematology ROS (+) Blood dyscrasia, anemia   Anesthesia Other Findings Medical History  History of hiatal hernia  Anemia GERD (gastroesophageal reflux disease)   Reproductive/Obstetrics negative OB ROS                             Anesthesia Physical Anesthesia Plan  ASA: 3  Anesthesia Plan: MAC   Post-op Pain Management:    Induction: Intravenous  PONV Risk Score and Plan:   Airway Management Planned: Natural Airway and Nasal Cannula  Additional Equipment:   Intra-op Plan:   Post-operative Plan:   Informed Consent: I have reviewed the patients History and Physical, chart, labs and discussed the procedure including the risks, benefits and alternatives for the proposed anesthesia with the patient or authorized representative who has indicated his/her understanding and acceptance.     Dental Advisory Given  Plan Discussed with: Anesthesiologist, CRNA and Surgeon  Anesthesia Plan  Comments: (Patient consented for risks of anesthesia including but not limited to:  - adverse reactions to medications - damage to eyes, teeth, lips or other oral mucosa - nerve damage due to positioning  - sore throat or hoarseness - Damage to heart, brain, nerves, lungs, other parts of body or loss of life  Patient voiced understanding and assent.)        Anesthesia Quick Evaluation

## 2024-04-24 NOTE — H&P (Signed)
 [facility]   Primary Care Physician:  Rudolpho Norleen BIRCH, MD Ophthalmologist: Dr. Dene Etienne  Pre-Procedure History & Physical: HPI:  Mariah Cooper is a 68 y.o. female here for ophthalmic surgery.   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   Thoracic aortic atherosclerosis (HCC) 2022    Past Surgical History:  Procedure Laterality Date   BREAST CYST ASPIRATION Right 01/15/2018   Neg   CATARACT EXTRACTION W/PHACO Right 04/10/2024   Procedure: PHACOEMULSIFICATION, CATARACT, WITH IOL INSERTION 3.56, 00:28.7;  Surgeon: Etienne Dene, MD;  Location: [facility];  Service: Ophthalmology;  Laterality: Right;   CHOLECYSTECTOMY     COLONOSCOPY N/A 07/14/2023   Procedure: COLONOSCOPY;  Surgeon: Onita Elspeth Sharper, DO;  Location: [facility];  Service: Gastroenterology;  Laterality: N/A;   ECTOPIC PREGNANCY SURGERY     INCONTINENCE SURGERY     mesh sling- 2013   LAPAROSCOPIC HYSTERECTOMY     OPEN REDUCTION INTERNAL FIXATION (ORIF) DISTAL RADIAL FRACTURE Left 08/31/2015   Procedure: OPEN REDUCTION INTERNAL FIXATION (ORIF) DISTAL RADIAL FRACTURE;  Surgeon: Kayla Pinal, MD;  Location: [facility];  Service: Orthopedics;  Laterality: Left;   POLYPECTOMY  07/14/2023   Procedure: POLYPECTOMY;  Surgeon: Onita Elspeth Sharper, DO;  Location: [facility];  Service: Gastroenterology;;   ROBOTIC ASSISTED LAPAROSCOPIC LYSIS OF ADHESION  09/16/2023   Procedure: XI ROBOTIC ASSISTED LAPAROSCOPIC LYSIS OF ADHESION;  Surgeon: Rodolph Romano, MD;  Location: [facility];  Service: General;;   SUBMUCOSAL TATTOO INJECTION  07/14/2023   Procedure:  SUBMUCOSAL TATTOO INJECTION;  Surgeon: Onita Elspeth Sharper, DO;  Location: [facility];  Service: Gastroenterology;;  at 25cm in colon   VAGINAL PROLAPSE REPAIR     bladder tack after hysterectomy   XI ROBOTIC ASSISTED SMALL BOWEL RESECTION N/A 09/16/2023   Procedure: XI ROBOTIC ASSISTED DIAGNOSTIC LAPAROSCOPY;  Surgeon: Rodolph Romano, MD;  Location: [facility];  Service: General;  Laterality: N/A;    Prior to Admission medications   Medication Sig Start Date End Date Taking? Authorizing Provider  calcium carbonate (OSCAL) 1500 (600 Ca) MG TABS tablet Take 600 mg of elemental calcium by mouth daily with breakfast.    [provider]  Cholecalciferol (VITAMIN D) 50 MCG (2000 UT) tablet Take 2,000 Units by mouth daily.    [provider]  HYDROcodone -acetaminophen  (NORCO) 5-325 MG tablet Take 1 tablet by mouth every 6 (six) hours as needed for up to 6 doses for moderate pain (pain score 4-6). Patient not taking: Reported on 04/02/2024 09/25/23   Sakai, Isami, DO  ibuprofen (ADVIL) 200 MG tablet Take 400 mg by mouth every 6 (six) hours as needed for mild pain (pain score 1-3) or moderate pain (pain score 4-6).    [provider]  Magnesium 100 MG TABS Take 250 mg by mouth daily.    [provider]  Multiple Vitamin (MULTIVITAMIN WITH MINERALS) TABS tablet Take 1 tablet by mouth daily. Centrum silver 50+    [provider]  POLYETHYLENE GLYCOL 3350 PO Take 1 Capful by mouth daily as needed for moderate constipation or mild constipation. Patient not  taking: Reported on 04/02/2024    [provider]    Allergies as of 03/04/2024 - Review Complete 09/09/2023  Allergen Reaction Noted   Sulfa antibiotics Itching 08/25/2015   Ivp dye [iodinated contrast media] Rash 08/25/2015   Tape Rash 08/28/2015    Family History  Problem Relation Age of Onset   Hyperlipidemia Mother    Hypertension Mother    Hypertension Father    Breast cancer  Neg Hx     Social History   Socioeconomic History   Marital status: Widowed    Spouse name: Not on file   Number of children: Not on file   Years of education: Not on file   Highest education level: Not on file  Occupational History   Not on file  Tobacco Use   Smoking status: Former    Current packs/day: 0.00    Average packs/day: 1 pack/day for 42.0 years (42.0 ttl pk-yrs)    Types: Cigarettes    Start date: 07/28/1973    Quit date: 07/29/2015    Years since quitting: 8.7   Smokeless tobacco: Never  Vaping Use   Vaping status: Never Used  Substance and Sexual Activity   Alcohol use: No   Drug use: No   Sexual activity: Not Currently  Other Topics Concern   Not on file  Social History Narrative   Lives alone   Social Drivers of Health   Financial Resource Strain: Not on file  Food Insecurity: No Food Insecurity (09/06/2023)   Hunger Vital Sign    Worried About Running Out of Food in the Last Year: Never true    Ran Out of Food in the Last Year: Never true  Transportation Needs: No Transportation Needs (09/06/2023)   PRAPARE - Administrator, Civil Service (Medical): No    Lack of Transportation (Non-Medical): No  Physical Activity: Not on file  Stress: Not on file  Social Connections: Not on file  Intimate Partner Violence: Not At Risk (09/06/2023)   Humiliation, Afraid, Rape, and Kick questionnaire    Fear of Current or Ex-Partner: No    Emotionally Abused: No    Physically Abused: No    Sexually Abused: No    Review of Systems: See HPI, otherwise negative ROS  Physical Exam: Ht 5' 3 (1.6 m)   Wt 65.3 kg   BMI 25.51 kg/m  General:   Alert,  pleasant and cooperative in NAD Head:  Normocephalic and atraumatic. Lungs:  Clear to auscultation.    Heart:  Regular rate and rhythm.   Impression/Plan: Mariah Cooper is here for ophthalmic surgery.  Risks, benefits, limitations, and alternatives regarding ophthalmic surgery have been  reviewed with the patient.  Questions have been answered.  All parties agreeable.   MITTIE GASKIN, MD  04/24/2024, 10:01 AM

## 2024-04-25 ENCOUNTER — Encounter: Payer: Self-pay | Admitting: Ophthalmology

## 2024-06-25 ENCOUNTER — Other Ambulatory Visit: Payer: Self-pay | Admitting: Sports Medicine

## 2024-06-25 DIAGNOSIS — M7551 Bursitis of right shoulder: Secondary | ICD-10-CM

## 2024-06-25 DIAGNOSIS — M19011 Primary osteoarthritis, right shoulder: Secondary | ICD-10-CM

## 2024-06-25 DIAGNOSIS — G8929 Other chronic pain: Secondary | ICD-10-CM

## 2024-06-25 DIAGNOSIS — M75121 Complete rotator cuff tear or rupture of right shoulder, not specified as traumatic: Secondary | ICD-10-CM

## 2024-06-25 DIAGNOSIS — M7541 Impingement syndrome of right shoulder: Secondary | ICD-10-CM

## 2024-06-26 ENCOUNTER — Ambulatory Visit
Admission: RE | Admit: 2024-06-26 | Discharge: 2024-06-26 | Disposition: A | Discharge status: Home / Self Care | Source: Ambulatory Visit | Attending: Sports Medicine | Admitting: Sports Medicine

## 2024-06-26 DIAGNOSIS — M75121 Complete rotator cuff tear or rupture of right shoulder, not specified as traumatic: Secondary | ICD-10-CM | POA: Insufficient documentation

## 2024-06-26 DIAGNOSIS — M25511 Pain in right shoulder: Secondary | ICD-10-CM | POA: Diagnosis present

## 2024-06-26 DIAGNOSIS — M7541 Impingement syndrome of right shoulder: Secondary | ICD-10-CM | POA: Diagnosis present

## 2024-06-26 DIAGNOSIS — G8929 Other chronic pain: Secondary | ICD-10-CM | POA: Insufficient documentation

## 2024-06-26 DIAGNOSIS — M19011 Primary osteoarthritis, right shoulder: Secondary | ICD-10-CM | POA: Insufficient documentation

## 2024-06-26 DIAGNOSIS — M7551 Bursitis of right shoulder: Secondary | ICD-10-CM | POA: Diagnosis present

## 2024-07-11 ENCOUNTER — Ambulatory Visit
Admission: RE | Admit: 2024-07-11 | Discharge: 2024-07-11 | Disposition: A | Discharge status: Home / Self Care | Source: Ambulatory Visit | Attending: Orthopedic Surgery | Admitting: Orthopedic Surgery

## 2024-07-11 ENCOUNTER — Other Ambulatory Visit: Payer: Self-pay | Admitting: Orthopedic Surgery

## 2024-07-11 ENCOUNTER — Inpatient Hospital Stay: Admission: RE | Admit: 2024-07-11 | Source: Ambulatory Visit

## 2024-07-11 DIAGNOSIS — M75121 Complete rotator cuff tear or rupture of right shoulder, not specified as traumatic: Secondary | ICD-10-CM | POA: Diagnosis present

## 2024-07-11 NOTE — Progress Notes (Signed)
 ORTHOPEDIC SURGERY - SHOULDER EVALUATION  Chief Complaint: Chief Complaint  Patient presents with  . Shoulder Pain    Right shoulder discuss MRI    History of Present Illness: 07/11/24: Mariah Cooper is a 68 y.o. female  referred by [facility] for R shoulder evaluation and management.  Prior medical records were reviewed. The patient was initially evaluated by Dr. Sharrie on 05/28/24. From his note:   At the time of this visit, I reviewed her most recent labs from 12/06/2023 which show creatinine 0.7, normal electrolytes, normal liver function, albumin 4.4, normal CBC.   Right Shoulder The patient reports that the problem began suddenly without injury 2 months ago. Primary symptoms include catching and pain. She reports the pain is present in their right shoulder and describes the pain as sharp and stabbing.  The patient does not have neck pain and the pain does not travel to fingers of the affected shoulder. When the joint is painful, she describes the pain as moderate (active but had to make modifications or give up activities). On a scale of 0 (no pain) to 10 (worst pain ever), her pain is at a 7/10 and the pain is activity related. Since the problem started it is worsening and does prevent her from performing any of her normal hobbies. She cannot perform sports at the same level as before the injury. The patient does not lift a lot of weight with her shoulders; and does not feel weak. Her symptoms do wake her from sleep. Symptoms are improved by acetaminophen , heat and rest and are worsened by carrying heavy objects, getting dressed, overhead activity and reaching behind back.   She denies any previous accident, injury, trauma to her arm.  The pain is located over her anterior and lateral shoulder.  She denies any symptoms distally.  She describes her pain as intermittent and sharp, stabbing.  She reports associated feeling of shoulder catching, pain at night.  She denies associated  swelling, instability, numbness or tingling, weakness, fevers or chills, night sweats, weight loss, skin color change.     She was given a subacromial space corticosteroid injection at that visit.  She currently rates pain severity as a 6/10. Her symptoms began as described above many months ago.  She states that the corticosteroid injection did not really help her symptoms.  She has attempted therapeutic exercises, but was unable to continue these due to significant pain.  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  She can work remotely from home and she also carpools with a friend to [employer] for work.  She has never smoked.  She lives alone, but has a sister that lives at across the street from her.  She did have surgery last year on her sigmoid colon and was in the hospital for approximately 3 weeks.  PMHx, PSurgHx, Fam Hx, Soc Hx, Meds, Allergies: Past Medical History:  Diagnosis Date  . Allergic state sulfa   contrast dye  . Diverticulosis   . GERD (gastroesophageal reflux disease)   . Osteoporosis   . Sleep apnea     Past Surgical History:  Procedure Laterality Date  . HYSTERECTOMY  1992   TVH with bladder tack, still has ovaries  . COLONOSCOPY  06/27/2006   Int Hemorrhoids, Diverticulosis: CBF 05/2016; Recall Ltr mailed 05/04/2016 (dw)  . EGD  06/27/2006   Normal: No repeat per RTE  . TVT  2013  .  COLONOSCOPY  01/23/2018   Adenomatous Polyp: CBF 12/2022  (01/19/2023 Recall letter returned.awb)  . Colon @ [facility]  07/14/2023   Hyperplastic polyp/PHx CP/TBD awaiting CT colonography/SMR  . sigmoid colectomy  09/06/2023   Dr Lucas Catchings --- Robotic  . CHOLECYSTECTOMY    . FRACTURE SURGERY  Wrist  . LAPAROSCOPIC REMOVAL ECTOPIC PREGNANCY    . TUBAL LIGATION      Family History  Problem Relation Age of Onset  . High blood pressure (Hypertension) Mother   . High blood pressure (Hypertension) Father      Social History   Socioeconomic History  . Marital status: Single  Tobacco Use  . Smoking status: Former    Current packs/day: 0.00    Average packs/day: 1 pack/day for 43.1 years (43.1 ttl pk-yrs)    Types: Cigarettes    Start date: 07/01/1972    Quit date: 08/22/2015    Years since quitting: 8.8  . Smokeless tobacco: Never  Vaping Use  . Vaping status: Never Used  Substance and Sexual Activity  . Alcohol use: Not Currently  . Drug use: No  . Sexual activity: Not Currently    Partners: Male    Birth control/protection: Surgical   Social Drivers of Health   Food Insecurity: No Food Insecurity (09/06/2023)   Received from [facility]   Hunger Vital Sign   . Within the past 12 months, you worried that your food would run out before you got the money to buy more.: Never true   . Within the past 12 months, the food you bought just didn't last and you didn't have money to get more.: Never true  Transportation Needs: No Transportation Needs (09/06/2023)   Received from [facility] - Transportation   . Lack of Transportation (Medical): No   . Lack of Transportation (Non-Medical): No  Housing Stability: Unknown (12/08/2023)   Housing Stability Vital Sign   . Homeless in the Last Year: No     Current Outpatient Medications  Medication Sig Dispense Refill  . acetaminophen  (TYLENOL ) 500 MG tablet Take 1,000 mg by mouth as needed for Pain    . calcium carbonate 600 mg calcium (1,500 mg) Tab tablet Take by mouth once daily    . calcium carbonate-vitamin D3 (CALTRATE 600+D) 600 mg-10 mcg (400 unit) tablet Take 1 tablet by mouth 2 (two) times daily with meals    . cholecalciferol, vitamin D3, (VITAMIN D3) 125 mcg (5,000 unit) tablet Take 5,000 Units by mouth once daily    . folic acid/multivit-min/lutein (CENTRUM SILVER ORAL) Take by mouth    . ibuprofen (MOTRIN) 200 MG tablet Take 400 mg by mouth as needed for Pain     No current facility-administered medications for this  visit.    Allergies  Allergen Reactions  . Dye Itching and Rash    contrast dye  . Iodinated Contrast Media Itching  . Sulfa (Sulfonamide Antibiotics) Itching and Rash  . Adhesive Tape-Silicones Rash    OK TO USE PAPER TAPE    . Phenazopyridine Rash    Review of Systems: A 10+ ROS was performed, reviewed, and the pertinent orthopaedic findings are documented in the HPI.  I have reviewed and agree with the ROS captured by the CMA.    Physical Exam: There were no vitals filed for this visit. General/Constitutional: NAD, conversant Eyes: Pupils equal and round, extraocular movements intact ENT: atraumatic external nose and ears, moist mucous membranes Respiratory: non-labored breathing, symmetric chest rise, chest sounds clear.  Cardiovascular: no visible lower extremity edema, peripheral pulses present, regular rate and rhythm  Skin: normal skin turgor, warm and dry Neurological: cranial nerves grossly intact, sensation grossly intact Psychological:  Appropriate mood and affect; appropriate judgment Musculoskeletal: as detailed below:   Comprehensive Shoulder Exam:   ROM   Right Left  Active (Passive) Forward Elevation  110 (140) 150  ER 30 (45) 45  IR T8 T6  90 degree abduction ER/IR 90/45 100/45  Crepitus None None  Capsulitis None None                                                                                       Tenderness                                         Right Left       Inspection   Right Left  Skin Normal Normal  Scapular Kinetics    Atrophy      Impingement / Rotator Cuff   Right Left  Neer Impingement Yes No  Hawkins Yes No  Champagne Toast     Empty Can/Jobe's 4+/5 5/5  ER Strength 4/5 5/5  Bear Hug 5/5 5/5  Belly Press normal normal  Hornblower's    ER Lag No No    AC / Biceps  / SLAP   Right Left  Speed's    Yergason's    O'Brien's    Cross Arm Adduction      Neurovascular   Right Left  Distal Motor Normal Normal   Distal Sensation Normal Normal  Distal Pulse Normal Normal    Imaging:  R Shoulder radiographs:  05/28/2024: On personal read, there are no fractures or dislocations.  There are mild degenerative changes of the glenohumeral joint with slight osteophyte formation about the inferior humeral head and inferior glenoid.  Joint spaces appear relatively well-maintained.  There are mild-moderate degenerative changes at the acromioclavicular joint.  There does appear to be a small, well-corticated loose body measuring approximately 3.5 x 2 mm located within the glenohumeral joint.  R Shoulder MRI: 06/26/24: FINDINGS:  Rotator cuff: Complete full-thickness, full width tear of the  supraspinatus and infraspinatus tendons with 4 cm of retraction.  Teres minor tendon is intact. Mild subscapularis tendinosis.   Muscles: No muscle atrophy or edema. No intramuscular fluid  collection or hematoma.   Biceps Long Head: Mild tendinosis of the intra-articular portion of  the long head of the biceps tendon.   Acromioclavicular Joint: Moderate arthropathy of the  acromioclavicular joint. Small amount of subacromial/subdeltoid  bursal fluid.   Glenohumeral Joint: Small joint effusion. Mild partial-thickness  cartilage loss of the glenohumeral joint.   Labrum: Degenerative tearing of the superior labrum.   Bones: No fracture or dislocation. No aggressive osseous lesion.   Other: No fluid collection or hematoma.   IMPRESSION:  1. Complete full-thickness, full width tear of the supraspinatus and  infraspinatus tendons with 4 cm of retraction.  2. Mild subscapularis tendinosis.  3. Mild tendinosis of the intra-articular portion of the long head  of the biceps tendon.  4. Mild partial-thickness cartilage loss of the glenohumeral joint.   Additionally, there is significant muscle atrophy of the supraspinatus and early fatty atrophy of the infraspinatus.  There is significant retraction of both the  supraspinatus and infraspinatus to the level of the glenoid.   I personally reviewed and visualized the aforementioned imaging studies. I additionally personally interpreted any radiographs taken during today's visit.   Assessment & Plan: There are no diagnoses linked to this encounter.   Armanda Forand Cooper is a 69 y.o. 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 1.  We discussed the various treatment options including both further conservative management as well as surgical intervention.  The patient has failed conservative measures including rest, medications, therapeutic exercises, activity modifications, and corticosteroid injections. After discussion of the risks, benefits, and alternatives to surgery, the patient elected to proceed with Right reverse shoulder arthroplasty and biceps tenodesis.  Preferred surgical date is 07/16/24 2.  The patient will need a CT scan of right shoulder with DJO Matchpoint protocol for preoperative planning. 3.  Slingshot 2 shoulder immobilizer was ordered, dispensed, fitted, and applied today in the office today to apply stabilization to the shoulder as seen in assessment above.  This immobilizer is medically necessary to protect the surgical repair and since the patient will be non-weight bearing. 4. F/u ~2 weeks after surgery.

## 2024-07-12 ENCOUNTER — Encounter
Admission: RE | Admit: 2024-07-12 | Discharge: 2024-07-12 | Disposition: A | Discharge status: Home / Self Care | Source: Ambulatory Visit | Attending: Orthopedic Surgery | Admitting: Orthopedic Surgery

## 2024-07-12 ENCOUNTER — Other Ambulatory Visit: Payer: Self-pay

## 2024-07-12 VITALS — BP 161/75 | HR 65 | Temp 98.1°F | Resp 18 | Ht 62.5 in | Wt 147.1 lb

## 2024-07-12 DIAGNOSIS — Z01818 Encounter for other preprocedural examination: Secondary | ICD-10-CM | POA: Diagnosis present

## 2024-07-12 DIAGNOSIS — Z01812 Encounter for preprocedural laboratory examination: Secondary | ICD-10-CM

## 2024-07-12 LAB — CBC WITH DIFFERENTIAL/PLATELET
Abs Immature Granulocytes: 0.04 K/uL (ref 0.00–0.07)
Basophils Absolute: 0.1 K/uL (ref 0.0–0.1)
Basophils Relative: 1 %
Eosinophils Absolute: 0.1 K/uL (ref 0.0–0.5)
Eosinophils Relative: 1 %
HCT: 38.5 % (ref 36.0–46.0)
Hemoglobin: 13.1 g/dL (ref 12.0–15.0)
Immature Granulocytes: 0 %
Lymphocytes Relative: 24 %
Lymphs Abs: 2.2 K/uL (ref 0.7–4.0)
MCH: 32.1 pg (ref 26.0–34.0)
MCHC: 34 g/dL (ref 30.0–36.0)
MCV: 94.4 fL (ref 80.0–100.0)
Monocytes Absolute: 1.1 K/uL — ABNORMAL HIGH (ref 0.1–1.0)
Monocytes Relative: 11 %
Neutro Abs: 6 K/uL (ref 1.7–7.7)
Neutrophils Relative %: 63 %
Platelets: 343 K/uL (ref 150–400)
RBC: 4.08 MIL/uL (ref 3.87–5.11)
RDW: 12.4 % (ref 11.5–15.5)
WBC: 9.5 K/uL (ref 4.0–10.5)
nRBC: 0 % (ref 0.0–0.2)

## 2024-07-12 LAB — COMPREHENSIVE METABOLIC PANEL WITH GFR
ALT: 16 U/L (ref 0–44)
AST: 19 U/L (ref 15–41)
Albumin: 4.2 g/dL (ref 3.5–5.0)
Alkaline Phosphatase: 55 U/L (ref 38–126)
Anion gap: 9 (ref 5–15)
BUN: 25 mg/dL — ABNORMAL HIGH (ref 8–23)
CO2: 25 mmol/L (ref 22–32)
Calcium: 9.9 mg/dL (ref 8.9–10.3)
Chloride: 107 mmol/L (ref 98–111)
Creatinine, Ser: 0.74 mg/dL (ref 0.44–1.00)
GFR, Estimated: >60 mL/min (ref >60)
Glucose, Bld: 76 mg/dL (ref 70–99)
Potassium: 4.3 mmol/L (ref 3.5–5.1)
Sodium: 141 mmol/L (ref 135–145)
Total Bilirubin: 0.7 mg/dL (ref 0.0–1.2)
Total Protein: 7.5 g/dL (ref 6.5–8.1)

## 2024-07-12 LAB — URINALYSIS, ROUTINE W REFLEX MICROSCOPIC
Bilirubin Urine: NEGATIVE
Glucose, UA: NEGATIVE mg/dL
Hgb urine dipstick: NEGATIVE
Ketones, ur: NEGATIVE mg/dL
Leukocytes,Ua: NEGATIVE
Nitrite: NEGATIVE
Protein, ur: NEGATIVE mg/dL
Specific Gravity, Urine: 1.018 (ref 1.005–1.030)
pH: 6 (ref 5.0–8.0)

## 2024-07-12 LAB — SURGICAL PCR SCREEN
MRSA, PCR: NEGATIVE
Staphylococcus aureus: NEGATIVE

## 2024-07-12 NOTE — Patient Instructions (Addendum)
 Your procedure is scheduled nw:ULZDIJB SEPTEMBER 16  Report to the Registration Desk on the 1st floor of the [facility]. To find out your arrival time, please call [phone] between 1PM - 3PM on:  MONDAY SEPTEMBER 15 If your arrival time is 6:00 am, do not arrive before that time as the [facility] entrance doors do not open until 6:00 am.  REMEMBER: Instructions that are not followed completely may result in serious medical risk, up to and including death; or upon the discretion of your surgeon and anesthesiologist your surgery may need to be rescheduled.  Do not eat food after midnight the night before surgery.  No gum chewing or hard candies.  You may however, drink CLEAR liquids up to 3 hours before you are scheduled to arrive for your surgery. Do not drink anything within 3 hours of your scheduled arrival time.  Clear liquids include: - water  - apple juice without pulp - gatorade (not RED colors) - black coffee or tea (Do NOT add milk or creamers to the coffee or tea) Do NOT drink anything that is not on this list.   In addition, your doctor has ordered for you to drink the provided:  Ensure Pre-Surgery Clear Carbohydrate Drink   Drinking this carbohydrate drink up to three hours before surgery helps to reduce insulin  resistance and improve patient outcomes. Please complete drinking 3 hours before scheduled arrival time.  One week prior to surgery: Stop Anti-inflammatories (NSAIDS) such as Advil, Aleve, Ibuprofen, Motrin, Naproxen, Naprosyn and Aspirin based products such as Excedrin, Goody's Powder, BC Powder. Stop ANY OVER THE COUNTER supplements until after surgery. Cholecalciferol (VITAMIN D)  Magnesium  Multiple Vitamin (MULTIVITAMIN WITH MINERALS)   You may however, continue to take Tylenol  if needed for pain up until the day of surgery.  Continue taking all of your other prescription medications up until the day of surgery.  ON THE DAY OF SURGERY DO NOT TAKE  ANY MEDICATIONS    No Alcohol for 24 hours before or after surgery.  Do not use any recreational drugs for at least a week (preferably 2 weeks) before your surgery.  Please be advised that the combination of cocaine and anesthesia may have negative outcomes, up to and including death. If you test positive for cocaine, your surgery will be cancelled.  On the morning of surgery brush your teeth with toothpaste and water, you may rinse your mouth with mouthwash if you wish. Do not swallow any toothpaste or mouthwash.  Use CHG Soap as directed on instruction sheet.  Do not wear jewelry, make-up, hairpins, clips or nail polish.  For welded (permanent) jewelry: bracelets, anklets, waist bands, etc.  Please have this removed prior to surgery.  If it is not removed, there is a chance that hospital personnel will need to cut it off on the day of surgery.  Do not wear lotions, powders, or perfumes.   Do not shave body hair from the neck down 48 hours before surgery.  Contact lenses, hearing aids and dentures may not be worn into surgery.  Do not bring valuables to the hospital. [facility] is not responsible for any missing/lost belongings or valuables.   Total Shoulder Arthroplasty:  use Benzoyl Peroxide 5% Gel as directed on instruction sheet.  Notify your doctor if there is any change in your medical condition (cold, fever, infection).  Wear comfortable clothing (specific to your surgery type) to the hospital.  After surgery, you can help prevent lung complications by doing  breathing exercises.  Take deep breaths and cough every 1-2 hours. Your doctor may order a device called an Incentive Spirometer to help you take deep breaths.  If you are being discharged the day of surgery, you will not be allowed to drive home. You will need a responsible individual to drive you home and stay with you for 24 hours after surgery.   If you are taking public transportation, you will need to have a  responsible individual with you.  Please call the Pre-admissions Testing Dept. at [phone] if you have any questions about these instructions.  Surgery Visitation Policy:  Patients having surgery or a procedure may have two visitors.  Children under the age of 67 must have an adult with them who is not the patient.   Merchandiser, retail to address health-related social needs:  [email or web address]     Pre-operative 5 CHG Bath Instructions   You can play a key role in reducing the risk of infection after surgery. Your skin needs to be as free of germs as possible. You can reduce the number of germs on your skin by washing with CHG (chlorhexidine  gluconate) soap before surgery. CHG is an antiseptic soap that kills germs and continues to kill germs even after washing.   DO NOT use if you have an allergy to chlorhexidine /CHG or antibacterial soaps. If your skin becomes reddened or irritated, stop using the CHG and notify one of our RNs at [phone].   Please shower with the CHG soap starting 4 days before surgery using the following schedule:  STARTING FRIDAY SEPTEMBER 12     Please keep in mind the following:  DO NOT shave, including legs and underarms, starting the day of your first shower.   You may shave your face at any point before/day of surgery.  Place clean sheets on your bed the day you start using CHG soap. Use a clean washcloth (not used since being washed) for each shower. DO NOT sleep with pets once you start using the CHG.   CHG Shower Instructions:  If you choose to wash your hair and private area, wash first with your normal shampoo/soap.  After you use shampoo/soap, rinse your hair and body thoroughly to remove shampoo/soap residue.  Turn the water OFF and apply about 3 tablespoons (45 ml) of CHG soap to a CLEAN washcloth.  Apply CHG soap ONLY FROM YOUR NECK DOWN TO YOUR TOES (washing for 3-5 minutes)  DO NOT use CHG soap on face,  private areas, open wounds, or sores.  Pay special attention to the area where your surgery is being performed.  If you are having back surgery, having someone wash your back for you may be helpful. Wait 2 minutes after CHG soap is applied, then you may rinse off the CHG soap.  Pat dry with a clean towel  Put on clean clothes/pajamas   If you choose to wear lotion, please use ONLY the CHG-compatible lotions on the back of this paper.     Additional instructions for the day of surgery: DO NOT APPLY any lotions, deodorants, cologne, or perfumes.   Put on clean/comfortable clothes.  Brush your teeth.  Ask your nurse before applying any prescription medications to the skin.      CHG Compatible Lotions   Aveeno Moisturizing lotion  Cetaphil Moisturizing Cream  Cetaphil Moisturizing Lotion  Clairol Herbal Essence Moisturizing Lotion, Dry Skin  Clairol Herbal Essence Moisturizing Lotion, Extra Dry Skin  Clairol Herbal Essence Moisturizing Lotion, Normal  Skin  Curel Age Defying Therapeutic Moisturizing Lotion with Alpha Hydroxy  Curel Extreme Care Body Lotion  Curel Soothing Hands Moisturizing Hand Lotion  Curel Therapeutic Moisturizing Cream, Fragrance-Free  Curel Therapeutic Moisturizing Lotion, Fragrance-Free  Curel Therapeutic Moisturizing Lotion, Original Formula  Eucerin Daily Replenishing Lotion  Eucerin Dry Skin Therapy Plus Alpha Hydroxy Crme  Eucerin Dry Skin Therapy Plus Alpha Hydroxy Lotion  Eucerin Original Crme  Eucerin Original Lotion  Eucerin Plus Crme Eucerin Plus Lotion  Eucerin TriLipid Replenishing Lotion  Keri Anti-Bacterial Hand Lotion  Keri Deep Conditioning Original Lotion Dry Skin Formula Softly Scented  Keri Deep Conditioning Original Lotion, Fragrance Free Sensitive Skin Formula  Keri Lotion Fast Absorbing Fragrance Free Sensitive Skin Formula  Keri Lotion Fast Absorbing Softly Scented Dry Skin Formula  Keri Original Lotion  Keri Skin Renewal Lotion  Keri Silky Smooth Lotion  Keri Silky Smooth Sensitive Skin Lotion  Nivea Body Creamy Conditioning Oil  Nivea Body Extra Enriched Lotion  Nivea Body Original Lotion  Nivea Body Sheer Moisturizing Lotion Nivea Crme  Nivea Skin Firming Lotion  NutraDerm 30 Skin Lotion  NutraDerm Skin Lotion  NutraDerm Therapeutic Skin Cream  NutraDerm Therapeutic Skin Lotion  ProShield Protective Hand Cream  Provon moisturizing lotion     How to Use an Incentive Spirometer  An incentive spirometer is a tool that measures how well you are filling your lungs with each breath. Learning to take long, deep breaths using this tool can help you keep your lungs clear and active. This may help to reverse or lessen your chance of developing breathing (pulmonary) problems, especially infection. You may be asked to use a spirometer: After a surgery. If you have a lung problem or a history of smoking. After a long period of time when you have been unable to move or be active. If the spirometer includes an indicator to show the highest number that you have reached, your health care provider or respiratory therapist will help you set a goal. Keep a log of your progress as told by your health care provider. What are the risks? Breathing too quickly may cause dizziness or cause you to pass out. Take your time so you do not get dizzy or light-headed. If you are in pain, you may need to take pain medicine before doing incentive spirometry. It is harder to take a deep breath if you are having pain. How to use your incentive spirometer  Sit up on the edge of your bed or on a chair. Hold the incentive spirometer so that it is in an upright position. Before you use the spirometer, breathe out normally. Place the mouthpiece in your mouth. Make sure your lips are closed tightly around it. Breathe in slowly and as deeply as you can through your mouth, causing the piston or the ball to rise toward the top of the chamber. Hold  your breath for 3-5 seconds, or for as long as possible. If the spirometer includes a coach indicator, use this to guide you in breathing. Slow down your breathing if the indicator goes above the marked areas. Remove the mouthpiece from your mouth and breathe out normally. The piston or ball will return to the bottom of the chamber. Rest for a few seconds, then repeat the steps 10 or more times. Take your time and take a few normal breaths between deep breaths so that you do not get dizzy or light-headed. Do this every 1-2 hours when you are awake. If the spirometer includes a goal  marker to show the highest number you have reached (best effort), use this as a goal to work toward during each repetition. After each set of 10 deep breaths, cough a few times. This will help to make sure that your lungs are clear. If you have an incision on your chest or abdomen from surgery, place a pillow or a rolled-up towel firmly against the incision when you cough. This can help to reduce pain while taking deep breaths and coughing. General tips When you are able to get out of bed: Walk around often. Continue to take deep breaths and cough in order to clear your lungs. Keep using the incentive spirometer until your health care provider says it is okay to stop using it. If you have been in the hospital, you may be told to keep using the spirometer at home. Contact a health care provider if: You are having difficulty using the spirometer. You have trouble using the spirometer as often as instructed. Your pain medicine is not giving enough relief for you to use the spirometer as told. You have a fever. Get help right away if: You develop shortness of breath. You develop a cough with bloody mucus from the lungs. You have fluid or blood coming from an incision site after you cough. Summary An incentive spirometer is a tool that can help you learn to take long, deep breaths to keep your lungs clear and  active. You may be asked to use a spirometer after a surgery, if you have a lung problem or a history of smoking, or if you have been inactive for a long period of time. Use your incentive spirometer as instructed every 1-2 hours while you are awake. If you have an incision on your chest or abdomen, place a pillow or a rolled-up towel firmly against your incision when you cough. This will help to reduce pain. Get help right away if you have shortness of breath, you cough up bloody mucus, or blood comes from your incision when you cough. This information is not intended to replace advice given to you by your health care provider. Make sure you discuss any questions you have with your health care provider. Document Revised: 01/06/2020 Document Reviewed: 01/06/2020 Elsevier Patient Education  2023 [employer].                Preparing for Total Shoulder Arthroplasty  Before surgery, you can play an important role by reducing the number of germs on your skin by using the following products:  Benzoyl Peroxide Gel  o Reduces the number of germs present on the skin  o Applied twice a day to shoulder area starting two days before surgery  Chlorhexidine  Gluconate (CHG) Soap  o An antiseptic cleaner that kills germs and bonds with the skin to continue killing germs even after washing  o Used for showering the night before surgery and morning of surgery  BENZOYL PEROXIDE 5% GEL                               Please do not use if you have an allergy to benzoyl peroxide. If your skin becomes reddened/irritated stop using the benzoyl peroxide.  Starting two days before surgery, apply as follows:  1. Apply benzoyl peroxide in the morning and at night. Apply after taking a shower. If you are not taking a shower, clean entire shoulder front, back, and side along with the armpit with  a clean wet washcloth.  2. Place a quarter-sized dollop on your shoulder and rub in thoroughly, making  sure to cover the front, back, and side of your shoulder, along with the armpit.  2 days before ____ AM ____ PM 1 day before ____ AM ____ PM  3. Do this twice a day for two days. (Last application is the night before surgery, AFTER using the CHG soap).  4. Do NOT apply benzoyl peroxide gel on the day of surgery.    Preoperative Educational Videos for Total Hip, Knee and Shoulder Replacements  To better prepare for surgery, please view our videos that explain the physical activity and discharge planning required to have the best surgical recovery at [facility].  [email or web address]  Questions? Call [phone] or email [email or web address]

## 2024-07-19 ENCOUNTER — Encounter
Admission: RE | Disposition: A | Discharge status: Home / Self Care | Payer: Self-pay | Source: Home / Self Care | Attending: Orthopedic Surgery

## 2024-07-19 ENCOUNTER — Ambulatory Visit

## 2024-07-19 ENCOUNTER — Ambulatory Visit: Admitting: Urgent Care

## 2024-07-19 ENCOUNTER — Encounter: Payer: Self-pay | Admitting: Orthopedic Surgery

## 2024-07-19 ENCOUNTER — Ambulatory Visit
Admission: RE | Admit: 2024-07-19 | Discharge: 2024-07-19 | Disposition: A | Discharge status: Home / Self Care | Attending: Orthopedic Surgery | Admitting: Orthopedic Surgery

## 2024-07-19 ENCOUNTER — Other Ambulatory Visit: Payer: Self-pay

## 2024-07-19 DIAGNOSIS — M75121 Complete rotator cuff tear or rupture of right shoulder, not specified as traumatic: Secondary | ICD-10-CM | POA: Insufficient documentation

## 2024-07-19 DIAGNOSIS — Z87891 Personal history of nicotine dependence: Secondary | ICD-10-CM | POA: Insufficient documentation

## 2024-07-19 DIAGNOSIS — K219 Gastro-esophageal reflux disease without esophagitis: Secondary | ICD-10-CM | POA: Diagnosis not present

## 2024-07-19 DIAGNOSIS — I10 Essential (primary) hypertension: Secondary | ICD-10-CM | POA: Diagnosis not present

## 2024-07-19 DIAGNOSIS — J449 Chronic obstructive pulmonary disease, unspecified: Secondary | ICD-10-CM | POA: Diagnosis not present

## 2024-07-19 DIAGNOSIS — K449 Diaphragmatic hernia without obstruction or gangrene: Secondary | ICD-10-CM | POA: Insufficient documentation

## 2024-07-19 DIAGNOSIS — M25711 Osteophyte, right shoulder: Secondary | ICD-10-CM | POA: Insufficient documentation

## 2024-07-19 HISTORY — PX: BICEPT TENODESIS: SHX5116

## 2024-07-19 HISTORY — PX: REVERSE SHOULDER ARTHROPLASTY: SHX5054

## 2024-07-19 SURGERY — ARTHROPLASTY, SHOULDER, TOTAL, REVERSE
Anesthesia: General | Site: Shoulder | Laterality: Right

## 2024-07-19 MED ORDER — FENTANYL CITRATE (PF) 100 MCG/2ML IJ SOLN
INTRAMUSCULAR | Status: DC | PRN
  Administered 2024-07-19: 50 ug via INTRAVENOUS

## 2024-07-19 MED ORDER — LACTATED RINGERS IV SOLN: INTRAVENOUS | Status: DC

## 2024-07-19 MED ORDER — GLYCOPYRROLATE 0.2 MG/ML IJ SOLN
INTRAMUSCULAR | Status: AC
  Filled 2024-07-19: qty 1

## 2024-07-19 MED ORDER — TRANEXAMIC ACID-NACL 1000-0.7 MG/100ML-% IV SOLN: 1000.0000 mg | INTRAVENOUS | Status: DC

## 2024-07-19 MED ORDER — BUPIVACAINE HCL (PF) 0.5 % IJ SOLN
INTRAMUSCULAR | Status: DC | PRN
  Administered 2024-07-19: 10 mL via PERINEURAL

## 2024-07-19 MED ORDER — ACETAMINOPHEN 500 MG PO TABS
1000.0000 mg | ORAL_TABLET | Freq: Three times a day (TID) | ORAL | 2 refills | Status: AC
Start: 2024-07-19 — End: 2025-07-19

## 2024-07-19 MED ORDER — CEFAZOLIN SODIUM-DEXTROSE 2-4 GM/100ML-% IV SOLN
INTRAVENOUS | Status: AC
  Filled 2024-07-19: qty 100

## 2024-07-19 MED ORDER — 0.9 % SODIUM CHLORIDE (POUR BTL) OPTIME
TOPICAL | Status: DC | PRN
  Administered 2024-07-19: 500 mL

## 2024-07-19 MED ORDER — TRANEXAMIC ACID-NACL 1000-0.7 MG/100ML-% IV SOLN
1000.0000 mg | Freq: Once | INTRAVENOUS | Status: AC
  Administered 2024-07-19: 1000 mg via INTRAVENOUS

## 2024-07-19 MED ORDER — LACTATED RINGERS IV SOLN: INTRAVENOUS | Status: DC | PRN

## 2024-07-19 MED ORDER — MIDAZOLAM HCL 2 MG/2ML IJ SOLN
INTRAMUSCULAR | Status: AC
  Filled 2024-07-19: qty 2

## 2024-07-19 MED ORDER — TRANEXAMIC ACID-NACL 1000-0.7 MG/100ML-% IV SOLN
INTRAVENOUS | Status: AC
  Filled 2024-07-19: qty 100

## 2024-07-19 MED ORDER — SUGAMMADEX SODIUM 200 MG/2ML IV SOLN
INTRAVENOUS | Status: DC | PRN
  Administered 2024-07-19: 200 mg via INTRAVENOUS

## 2024-07-19 MED ORDER — LIDOCAINE HCL (PF) 2 % IJ SOLN
INTRAMUSCULAR | Status: AC
  Filled 2024-07-19: qty 5

## 2024-07-19 MED ORDER — LIDOCAINE HCL (PF) 1 % IJ SOLN
INTRAMUSCULAR | Status: AC
  Filled 2024-07-19: qty 5

## 2024-07-19 MED ORDER — FENTANYL CITRATE (PF) 100 MCG/2ML IJ SOLN
INTRAMUSCULAR | Status: AC
  Filled 2024-07-19: qty 2

## 2024-07-19 MED ORDER — CEFAZOLIN SODIUM-DEXTROSE 2-4 GM/100ML-% IV SOLN
2.0000 g | INTRAVENOUS | Status: AC
Start: 2024-07-19 — End: 2024-07-19
  Administered 2024-07-19: 2 g via INTRAVENOUS

## 2024-07-19 MED ORDER — PHENYLEPHRINE HCL-NACL 20-0.9 MG/250ML-% IV SOLN
INTRAVENOUS | Status: DC | PRN
Start: 2024-07-19 — End: 2024-07-19
  Administered 2024-07-19: 40 ug/min via INTRAVENOUS

## 2024-07-19 MED ORDER — TRANEXAMIC ACID-NACL 1000-0.7 MG/100ML-% IV SOLN
1000.0000 mg | INTRAVENOUS | Status: AC
  Administered 2024-07-19: 1000 mg via INTRAVENOUS

## 2024-07-19 MED ORDER — FENTANYL CITRATE PF 50 MCG/ML IJ SOSY
PREFILLED_SYRINGE | INTRAMUSCULAR | Status: AC
  Filled 2024-07-19: qty 1

## 2024-07-19 MED ORDER — ASPIRIN 325 MG PO TBEC: 325.0000 mg | DELAYED_RELEASE_TABLET | Freq: Every day | ORAL | 0 refills | Status: AC

## 2024-07-19 MED ORDER — DROPERIDOL 2.5 MG/ML IJ SOLN: 0.6250 mg | Freq: Once | INTRAMUSCULAR | Status: DC | PRN

## 2024-07-19 MED ORDER — ROCURONIUM BROMIDE 10 MG/ML (PF) SYRINGE
PREFILLED_SYRINGE | INTRAVENOUS | Status: AC
  Filled 2024-07-19: qty 10

## 2024-07-19 MED ORDER — PROPOFOL 10 MG/ML IV BOLUS
INTRAVENOUS | Status: AC
  Filled 2024-07-19: qty 20

## 2024-07-19 MED ORDER — FENTANYL CITRATE PF 50 MCG/ML IJ SOSY
50.0000 ug | PREFILLED_SYRINGE | Freq: Once | INTRAMUSCULAR | Status: AC
  Administered 2024-07-19: 50 ug via INTRAVENOUS

## 2024-07-19 MED ORDER — ACETAMINOPHEN 10 MG/ML IV SOLN
INTRAVENOUS | Status: DC | PRN
  Administered 2024-07-19: 1000 mg via INTRAVENOUS

## 2024-07-19 MED ORDER — LIDOCAINE HCL (PF) 1 % IJ SOLN
INTRAMUSCULAR | Status: DC | PRN
  Administered 2024-07-19: 2.5 mL via SUBCUTANEOUS

## 2024-07-19 MED ORDER — ACETAMINOPHEN 10 MG/ML IV SOLN
INTRAVENOUS | Status: AC
  Filled 2024-07-19: qty 100

## 2024-07-19 MED ORDER — LIDOCAINE HCL (CARDIAC) PF 100 MG/5ML IV SOSY
PREFILLED_SYRINGE | INTRAVENOUS | Status: DC | PRN
  Administered 2024-07-19: 60 mg via INTRAVENOUS

## 2024-07-19 MED ORDER — CEFAZOLIN SODIUM-DEXTROSE 1-4 GM/50ML-% IV SOLN: 1.0000 g | Freq: Once | INTRAVENOUS | Status: DC

## 2024-07-19 MED ORDER — BUPIVACAINE HCL (PF) 0.5 % IJ SOLN
INTRAMUSCULAR | Status: AC
  Filled 2024-07-19: qty 10

## 2024-07-19 MED ORDER — CHLORHEXIDINE GLUCONATE 0.12 % MT SOLN
OROMUCOSAL | Status: AC
Start: 2024-07-19 — End: 2024-07-19
  Filled 2024-07-19: qty 15

## 2024-07-19 MED ORDER — DEXAMETHASONE SODIUM PHOSPHATE 10 MG/ML IJ SOLN
INTRAMUSCULAR | Status: AC
  Filled 2024-07-19: qty 1

## 2024-07-19 MED ORDER — BUPIVACAINE LIPOSOME 1.3 % IJ SUSP
INTRAMUSCULAR | Status: DC | PRN
  Administered 2024-07-19: 15 mL via PERINEURAL

## 2024-07-19 MED ORDER — VANCOMYCIN HCL 1000 MG IV SOLR
INTRAVENOUS | Status: AC
  Filled 2024-07-19: qty 20

## 2024-07-19 MED ORDER — ROCURONIUM BROMIDE 100 MG/10ML IV SOLN
INTRAVENOUS | Status: DC | PRN
  Administered 2024-07-19: 40 mg via INTRAVENOUS
  Administered 2024-07-19: 30 mg via INTRAVENOUS

## 2024-07-19 MED ORDER — PROPOFOL 1000 MG/100ML IV EMUL
INTRAVENOUS | Status: AC
  Filled 2024-07-19: qty 100

## 2024-07-19 MED ORDER — SODIUM CHLORIDE 0.9 % IR SOLN
Status: DC | PRN
  Administered 2024-07-19: 1000 mL

## 2024-07-19 MED ORDER — FENTANYL CITRATE (PF) 100 MCG/2ML IJ SOLN: 25.0000 ug | INTRAMUSCULAR | Status: DC | PRN

## 2024-07-19 MED ORDER — PHENYLEPHRINE 80 MCG/ML (10ML) SYRINGE FOR IV PUSH (FOR BLOOD PRESSURE SUPPORT)
PREFILLED_SYRINGE | INTRAVENOUS | Status: DC | PRN
  Administered 2024-07-19 (×2): 160 ug via INTRAVENOUS

## 2024-07-19 MED ORDER — ORAL CARE MOUTH RINSE: 15.0000 mL | Freq: Once | OROMUCOSAL | Status: AC

## 2024-07-19 MED ORDER — ONDANSETRON 4 MG PO TBDP: 4.0000 mg | ORAL_TABLET | Freq: Three times a day (TID) | ORAL | 0 refills | Status: AC | PRN

## 2024-07-19 MED ORDER — PROPOFOL 10 MG/ML IV BOLUS
INTRAVENOUS | Status: DC | PRN
  Administered 2024-07-19: 50 ug/kg/min via INTRAVENOUS
  Administered 2024-07-19: 30 mg via INTRAVENOUS
  Administered 2024-07-19: 120 mg via INTRAVENOUS

## 2024-07-19 MED ORDER — PHENYLEPHRINE HCL-NACL 20-0.9 MG/250ML-% IV SOLN
INTRAVENOUS | Status: AC
  Filled 2024-07-19: qty 250

## 2024-07-19 MED ORDER — VANCOMYCIN HCL 1000 MG IV SOLR
INTRAVENOUS | Status: DC | PRN
  Administered 2024-07-19: 1000 mg via TOPICAL

## 2024-07-19 MED ORDER — ONDANSETRON HCL 4 MG/2ML IJ SOLN
INTRAMUSCULAR | Status: AC
Start: 2024-07-19 — End: 2024-07-19
  Filled 2024-07-19: qty 2

## 2024-07-19 MED ORDER — OXYCODONE HCL 5 MG PO TABS: 5.0000 mg | ORAL_TABLET | ORAL | 0 refills | Status: AC | PRN

## 2024-07-19 MED ORDER — DEXAMETHASONE SODIUM PHOSPHATE 10 MG/ML IJ SOLN
INTRAMUSCULAR | Status: DC | PRN
  Administered 2024-07-19: 10 mg via INTRAVENOUS

## 2024-07-19 MED ORDER — CHLORHEXIDINE GLUCONATE 0.12 % MT SOLN
15.0000 mL | Freq: Once | OROMUCOSAL | Status: AC
  Administered 2024-07-19: 15 mL via OROMUCOSAL

## 2024-07-19 MED ORDER — BUPIVACAINE LIPOSOME 1.3 % IJ SUSP
INTRAMUSCULAR | Status: AC
  Filled 2024-07-19: qty 20

## 2024-07-19 SURGICAL SUPPLY — 64 items
BASEPLATE P2 COATD GLND 6.5X30 (Shoulder) IMPLANT
BIT DRILL 12.7X2STRG SHNK (BIT) IMPLANT
BLADE SAGITTAL WIDE XTHICK NO (BLADE) ×1 IMPLANT
CHLORAPREP W/TINT 26 (MISCELLANEOUS) ×1 IMPLANT
COOLER ICEMAN CLASSIC (MISCELLANEOUS) ×1 IMPLANT
DERMABOND ADVANCED .7 DNX12 (GAUZE/BANDAGES/DRESSINGS) IMPLANT
DRAPE INCISE IOBAN 66X45 STRL (DRAPES) ×1 IMPLANT
DRAPE SHEET LG 3/4 BI-LAMINATE (DRAPES) ×1 IMPLANT
DRAPE TABLE BACK 80X90 (DRAPES) ×1 IMPLANT
DRILL GLEN ALTIVATE 3.5 (DRILL) IMPLANT
DRSG OPSITE POSTOP 4X8 (GAUZE/BANDAGES/DRESSINGS) ×1 IMPLANT
DRSG TEGADERM 2-3/8X2-3/4 SM (GAUZE/BANDAGES/DRESSINGS) ×1 IMPLANT
ELECTRODE REM PT RTRN 9FT ADLT (ELECTROSURGICAL) ×1 IMPLANT
EVACUATOR 1/8 PVC DRAIN (DRAIN) ×1 IMPLANT
GAUZE SPONGE 2X2 STRL 8-PLY (GAUZE/BANDAGES/DRESSINGS) ×1 IMPLANT
GAUZE XEROFORM 1X8 LF (GAUZE/BANDAGES/DRESSINGS) IMPLANT
GLOVE BIOGEL PI IND STRL 8 (GLOVE) ×2 IMPLANT
GLOVE PI ULTRA LF STRL 7.5 (GLOVE) ×2 IMPLANT
GLOVE SURG ORTHO 8.0 STRL STRW (GLOVE) ×2 IMPLANT
GOWN SRG LRG LVL 4 IMPRV REINF (GOWNS) ×2 IMPLANT
GOWN SRG XL LONG LVL 3 NONREIN (GOWNS) ×1 IMPLANT
GUIDE WIRE ALTIVATE 2.4X228 SL (WIRE) IMPLANT
GUIDEWIRE GLENOID 2.5X220 (WIRE) ×1 IMPLANT
HOOD PEEL AWAY T7 (MISCELLANEOUS) ×3 IMPLANT
INSERT SMALL SOCKET 32MM NEU (Insert) IMPLANT
IV NS 1000ML BAXH (IV SOLUTION) ×1 IMPLANT
KIT STABILIZATION SHOULDER (MISCELLANEOUS) ×1 IMPLANT
LAVAGE JET IRRISEPT WOUND (IRRIGATION / IRRIGATOR) IMPLANT
MANIFOLD NEPTUNE II (INSTRUMENTS) ×1 IMPLANT
MASK FACE SPIDER DISP (MASK) ×1 IMPLANT
MAT ABSORB FLUID 56X50 GRAY (MISCELLANEOUS) ×1 IMPLANT
NDL REVERSE CUT 1/2 CRC (NEEDLE) IMPLANT
NDL SPNL 20GX3.5 QUINCKE YW (NEEDLE) IMPLANT
NEEDLE REVERSE CUT 1/2 CRC (NEEDLE) IMPLANT
NEEDLE SPNL 20GX3.5 QUINCKE YW (NEEDLE) ×1 IMPLANT
NS IRRIG 500ML POUR BTL (IV SOLUTION) ×1 IMPLANT
PACK ARTHROSCOPY SHOULDER (MISCELLANEOUS) ×1 IMPLANT
PAD COLD SHLDR SM WRAP-ON (PAD) ×1 IMPLANT
PENCIL SMOKE EVACUATOR (MISCELLANEOUS) ×1 IMPLANT
SCREW PERI ALTIVATE REV 14 (Screw) IMPLANT
SCREW PERI ALTIVATE REV 30 (Screw) IMPLANT
SCREW PERI ALTIVATE REV 34 (Screw) IMPLANT
SCREW RETAIN W/HEAD 4MM OFFSET (Shoulder) IMPLANT
SLING ULTRA II LG (MISCELLANEOUS) IMPLANT
SLING ULTRA II M (MISCELLANEOUS) IMPLANT
SPONGE T-LAP 18X18 ~~LOC~~+RFID (SPONGE) ×2 IMPLANT
STAPLER SKIN PROX 35W (STAPLE) IMPLANT
STEM HUMERAL SM SHELL SHOU 10 (Miscellaneous) IMPLANT
STRAP SAFETY 5IN WIDE (MISCELLANEOUS) ×1 IMPLANT
SUT MNCRL AB 4-0 PS2 18 (SUTURE) IMPLANT
SUT PROLENE 6 0 P 1 18 (SUTURE) IMPLANT
SUT TICRON 2-0 30IN 311381 (SUTURE) ×2 IMPLANT
SUT VIC AB 0 CT1 36 (SUTURE) ×1 IMPLANT
SUT VIC AB 2-0 CT2 27 (SUTURE) ×2 IMPLANT
SUT XBRAID 1.4 BLK/WHT (SUTURE) IMPLANT
SUT XBRAID 1.4 BLUE (SUTURE) IMPLANT
SUT XBRAID 1.4 WHITE/BLUE (SUTURE) IMPLANT
SUT XBRAID 2 BLACK/BLUE (SUTURE) IMPLANT
SUTURE ETHBND 5-0 MS/4 CCS GRN (SUTURE) IMPLANT
SUTURE FIBERWR #2 38 BLUE 1/2 (SUTURE) ×4 IMPLANT
TAP CANN GLEN 6.5 (TAP) IMPLANT
TIP FAN IRRIG PULSAVAC PLUS (DISPOSABLE) ×1 IMPLANT
TRAP FLUID SMOKE EVACUATOR (MISCELLANEOUS) ×1 IMPLANT
WATER STERILE IRR 1000ML POUR (IV SOLUTION) ×1 IMPLANT

## 2024-07-19 NOTE — Evaluation (Signed)
 Occupational Therapy Evaluation Patient Details Name: Mariah Cooper MRN: [id number] DOB: January 08, 1956 Today's Date: 07/19/2024   History of Present Illness   Mariah Cooper is a 67yoF with chronic shoulder pain, RT tear, presenting 07/19/24 for Rt rTSA c biceps tenodesis.    Clinical Impressions Mariah Cooper was seen for OT evaluation this date. Prior to hospital admission, Mariah Cooper was IND including working. Mariah Cooper lives alone, sisters and daughter available PRN. Mariah Cooper currently requires MAX A don/doff sling, polar care, and shirt in sitting. MOD I clothing mgmt in standing. Mariah Cooper instructed in polar care mgt, compression stockings mgt, sling/immobilizer mgt, ROM exercises for RUE (with instructions for no shoulder exercises until full sensation has returned), and adaptive strategies for bathing/dressing. All education complete, will sign off. Upon hospital discharge, recommend no OT follow up.     If plan is discharge home, recommend the following:   A lot of help with bathing/dressing/bathroom     Functional Status Assessment   Patient has had a recent decline in their functional status and demonstrates the ability to make significant improvements in function in a reasonable and predictable amount of time.     Equipment Recommendations   None recommended by OT     Recommendations for Other Services         Precautions/Restrictions   Precautions Precautions: Shoulder Shoulder Interventions: Shoulder sling/immobilizer;Off for dressing/bathing/exercises Recall of Precautions/Restrictions: Intact Required Braces or Orthoses: Sling Restrictions Weight Bearing Restrictions Per Provider Order: Yes RUE Weight Bearing Per Provider Order: Non weight bearing     Mobility Bed Mobility               General bed mobility comments: not tested    Transfers Overall transfer level: Independent                        Balance Overall balance assessment: No  apparent balance deficits (not formally assessed)                                         ADL either performed or assessed with clinical judgement   ADL Overall ADL's : Needs assistance/impaired                                       General ADL Comments: MAX A don/doff sling, polar care, and shirt in sitting. MOD I clothing mgmt in standing.      Vision         Perception         Praxis         Pertinent Vitals/Pain Pain Assessment Pain Assessment: No/denies pain     Extremity/Trunk Assessment Upper Extremity Assessment Upper Extremity Assessment: RUE deficits/detail RUE: Unable to fully assess due to immobilization   Lower Extremity Assessment Lower Extremity Assessment: Overall WFL for tasks assessed       Communication Communication Communication: No apparent difficulties   Cognition Arousal: Alert Behavior During Therapy: WFL for tasks assessed/performed Cognition: No apparent impairments                               Following commands: Intact       Cueing  General Comments  Exercises     Shoulder Instructions      Home Living Family/patient expects to be discharged to:: Private residence Living Arrangements: Alone Available Help at Discharge: Family;Available PRN/intermittently Type of Home: House Home Access: Stairs to enter [employer] of Steps: 3 Entrance Stairs-Rails: Right Home Layout: Two level;Bed/bath upstairs;1/2 bath on main level Alternate Level Stairs-Number of Steps: 13                        Prior Functioning/Environment Prior Level of Function : Independent/Modified Independent;Working/employed;Driving             Mobility Comments: working in an office      OT Problem List: Decreased range of motion;Impaired UE functional use   OT Treatment/Interventions:        OT Goals(Current goals can be found in the care plan section)    Acute Rehab OT Goals Patient Stated Goal: to go home OT Goal Formulation: With patient/family Time For Goal Achievement: 07/19/24 Potential to Achieve Goals: Good   OT Frequency:       Co-evaluation              AM-PAC OT 6 Clicks Daily Activity     Outcome Measure Help from another person eating meals?: None Help from another person taking care of personal grooming?: A Little Help from another person toileting, which includes using toliet, bedpan, or urinal?: A Little Help from another person bathing (including washing, rinsing, drying)?: A Little Help from another person to put on and taking off regular upper body clothing?: A Lot Help from another person to put on and taking off regular lower body clothing?: A Little 6 Click Score: 18   End of Session    Activity Tolerance: Patient tolerated treatment well Patient left: in chair;with call bell/phone within reach;with family/visitor present  OT Visit Diagnosis: Other abnormalities of gait and mobility (R26.89);Muscle weakness (generalized) (M62.81)                Time: 8796-8769 OT Time Calculation (min): 27 min Charges:  OT General Charges $OT Visit: 1 Visit OT Evaluation $OT Eval Low Complexity: 1 Low OT Treatments $Self Care/Home Management : 8-22 mins Elston Slot, M.S. OTR/L  07/19/24, 1:30 PM  ascom [phone]

## 2024-07-19 NOTE — Discharge Instructions (Signed)
Mariah Albee, MD  [facility]  Phone: [phone]  Fax: [phone]   Discharge Instructions after Reverse Shoulder Replacement    1. Activity/Sling: You are to be non-weight bearing on operative extremity. A sling/shoulder immobilizer has been provided for you. Only remove the sling to perform elbow, wrist, and hand RoM exercises and hygiene/dressing. Active reaching and lifting are not permitted. You will be given further instructions on sling use at your first physical therapy visit and postoperative visit with Dr. Allena Katz.   2. Dressings: Dressing may be removed at 1st physical therapy visit (~3-4 days after surgery). Afterwards, you may either leave open to air (if no drainage) or cover with dry, sterile dressing. If you have steri-strips on your wound, please do not remove them. They will fall off on their own. You may shower 5 days after surgery. Please pat incision dry. Do not rub or place any shear forces across incision. If there is drainage or any opening of incision after 5 days, please notify our offices immediately.    3. Driving:  Plan on not driving for six weeks. Please note that you are advised NOT to drive while taking narcotic pain medications as you may be impaired and unsafe to drive.   4. Medications:  - You have been provided a prescription for narcotic pain medicine (usually oxycodone). After surgery, take 1-2 narcotic tablets every 4 hours if needed for severe pain. Please start this as soon as you begin to start having pain (if you received a nerve block, start taking as soon as this wears off).  - A prescription for anti-nausea medication will be provided in case the narcotic medicine causes nausea - take 1 tablet every 6 hours only if nauseated.  - Take enteric coated aspirin 325 mg once daily for 6 weeks to prevent blood clots. Do not take aspirin if you have an aspirin sensitivity/allergy or asthma or are on an anticoagulant (blood thinner) already. If so, then  your home anticoagulant will be resume and managed - do not take aspirin. -Take tylenol 1000mg  (2 Extra strength or 3 regular strength tablets) every 8 hours for pain. This will reduce the amount of narcotic medication needed. May stop tylenol when you are having minimal pain. - Take a stool softener (Colace, Dulcolax or Senakot) if you are using narcotic pain medications to help with constipation that is associated with narcotic use. - DO NOT take ANY nonsteroidal anti-inflammatory pain medications: Advil, Motrin, Ibuprofen, Aleve, Naproxen, or Naprosyn.   If you are taking prescription medication for anxiety, depression, insomnia, muscle spasm, chronic pain, or for attention deficit disorder you are advised that you are at a higher risk of adverse effects with use of narcotics post-op, including narcotic addiction/dependence, depressed breathing, death. If you use non-prescribed substances: alcohol, marijuana, cocaine, heroin, methamphetamines, etc., you are at a higher risk of adverse effects with use of narcotics post-op, including narcotic addiction/dependence, depressed breathing, death. You are advised that taking > 50 morphine milligram equivalents (MME) of narcotic pain medication per day results in twice the risk of overdose or death. For your prescription provided: oxycodone 5 mg - taking more than 6 tablets per day after the first few days of surgery.   5. Physical Therapy: 1-2 times per week for ~12 weeks. Therapy typically starts on post operative Day 3 or 4. You have been provided an order for physical therapy. The therapist will provide home exercises. Please contact our offices if this appointment has not been scheduled.  6. Work: May do light duty/desk job in approximately 2 weeks when off of narcotics, pain is well-controlled, and swelling has decreased if able to function with one arm in sling. Full work may take 6 weeks if light motions and function of both arms is required.  Lifting jobs may require 12 weeks.   7. Post-Op Appointments: Your first post-op appointment will be with Dr. Allena Katz in approximately 2 weeks time.    If you find that they have not been scheduled please call the Orthopaedic Appointment front desk at [phone].                               Mariah Albee, MD [facility] Phone: [phone] Fax: [phone]   REVERSE SHOULDER ARTHROPLASTY REHAB GUIDELINES   These guidelines should be tailored to individual patients based on their rehab goals, age, precautions, quality of repair, etc.  Progression should be based on patient progress and approval by the referring physician.  PHASE 1 - Day 1 through Week 2  GENERAL GUIDELINES AND PRECAUTIONS Sling wear 24/7 except during grooming and home exercises (3 to 5 times daily) Avoid shoulder extension such that the arm is posterior the frontal plane.  When patients recline, a pillow should be placed behind the upper arm and sling should be on.  They should be advised to always be able to see the elbow Avoid combined IR/ADD/EXT, such as hand behind back to prevent dislocation Avoid combined IR and ADD such as reaching across the chest to prevent dislocation No AROM No submersion in pool/water for 4 weeks No weight bearing through operative arm (as in transfers, walker use, etc.)  GOALS Maintain integrity of joint replacement; protect soft tissue healing Increase PROM for elevation to 120 and ER to 30 (will remain the goal for first 6 weeks) Optimize distal UE circulation and muscle activity (elbow, wrist and hand) Instruct in use of sling for proper fit, polar care device for ice application after HEP, signs/symptoms of infection  EXERCISES Active elbow, wrist and hand Passive forward elevation in scapular plane to 90-120 max motion; ER in scapular plane to 30 Active scapular retraction with arms resting in neutral position  CRITERIA TO PROGRESS TO  PHASE 2 Low pain (less than 3/10) with shoulder PROM Healing of incision without signs of infection Clearance by MD to advance after 2 week MD check up  PHASE 2 - 2 weeks - 6 weeks  GENERAL GUIDELINES AND PRECAUTIONS Sling may be removed while at home; worn in community without abduction pillow May use arm for light activities of daily living (such as feeding, brushing teeth, dressing.) with elbow near  the side of the body  and arm in front of the body- no active lifting of the arm May submerge in water (tub, pool, [place], etc.) after 4 weeks Continue to avoid WBing through the operative arm Continue to avoid combined IR/EXT/ADD (hand behind the back) and IR/ADD  (reaching across chest) for dislocation precautions  GOALS  Achieve passive elevation to 120 and ER to 30  Low (less than 3/10) to no pain  Ability to fire all heads of the deltoid  EXERCISES May discontinue grip, and active elbow and wrist exercises since using the arm in ADL's  with sling removed around the home Continue passive elevation to 120 and ER to 30, both in scapular plane with arm supported on table top Add submaximal isometrics, pain free effort, for all  functional heads of deltoid (anterior, posterior, middle)  Ensure that with posterior deltoid isometric the shoulder does not move into extension and the arm remains anterior the frontal plane At 4 weeks:  begin to place arm in balanced position of 90 deg elevation in supine; when patient able to hold this position with ease, may begin reverse pendulums clockwise and counterclockwise  CRITERIA TO PROGRESS TO PHASE 3 Passive forward elevation in scapular plane to 120; passive ER in scapular plane to 30 Ability to fire isometrically all heads of the deltoid muscle without pain Ability to place and hold the arm in balanced position (90 deg elevation in supine)  PHASE 3 - 6 weeks to 3 months  GENERAL GUIDELINES AND PRECAUTIONS Discontinue use of sling Avoid  forcing end range motion in any direction to prevent dislocation  May advance use of the arm actively in ADL's without being restricted to arm by the side of the body, however, avoid heavy lifting and sports (forever!) May initiate functional IR behind the back gently NO UPPER BODY ERGOMETER   GOALS Optimize PROM for elevation and ER in scapular plane with realistic expectation that max  mobility for elevation is usually around 145-160 passively; ER 40 to 50 passively; functional IR to L1 Recover AROM to approach as close to PROM available as possible; may expect 135-150 deg active elevation; 30 deg active ER; active functional IR to L1 Establish dynamic stability of the shoulder with deltoid and periscapular muscle gradual strengthening  EXERCISES Forward elevation in scapular plane active progression: supine to incline, to vertical; short to long lever arm Balanced position long lever arm AROM Active ER/IR with arm at side Scapular retraction with light band resistance Functional IR with hand slide up back - very gentle and gradual NO UPPER BODY ERGOMETER     CRITERIA TO PROGRESS TO PHASE 4  AROM equals/approaches PROM with good mechanics for elevation   No pain  Higher level demand on shoulder than ADL functions   PHASE 4 12 months and beyond  GENERAL GUIDELINES AND PRECAUTIONS No heavy lifting and no overhead sports No heavy pushing activity Gradually increase strength of deltoid and scapular stabilizers; also the rotator cuff if present with weights not to exceed 5 lbs NO UPPER BODY ERGOMETER   GOALS  Optimize functional use of the operative UE to meet the desired demands  Gradual increase in deltoid, scapular muscle, and rotator cuff strength  Pain free functional activities   EXERCISES Add light hand weights for deltoid up to and not to exceed 3 lbs for anterior and posterior with long arm lift against gravity; elbow bent to 90 deg for abduction in scapular  plane Theraband progression for extension to hip with scapular depression/retraction Theraband progression for serratus anterior punches in supine; avoid wall, incline or prone pressups for serratus anterior End range stretching gently without forceful overpressure in all planes (elevation in scapular plane, ER in scapular plane, functional IR) with stretching done for life as part of a daily routine NO UPPER BODY ERGOMETER     CRITERIA FOR DISCHARGE FROM SKILLED PHYSICAL THERAPY  Pain free AROM for shoulder elevation (expect around 135-150)  Functional strength for all ADL's, work tasks, and hobbies approved by Careers adviser  Independence with home maintenance program   NOTES: 1. With proper exercise, motion, strength, and function continue to improve even after one year. 2. The complication rate after surgery is 5 - 8%. Complications include infection, fracture, heterotopic bone formation, nerve injury, instability, rotator cuff  tear, and tuberosity nonunion. Please look for clinical signs, unusual symptoms, or lack of progress with therapy and report those to Dr. Allena Katz. Prefer more communication than less.  3. The therapy plan above only serves as a guide. Please be aware of specific individualized patient instructions as written on the prescription or through discussions with the surgeon. 4. Please call Dr. Allena Katz if you have any specific questions or concerns [phone]

## 2024-07-19 NOTE — Anesthesia Procedure Notes (Signed)
 Procedure Name: Intubation Date/Time: 07/19/2024 7:59 AM  Performed by: Lennie Lamarr HERO, CRNAPre-anesthesia Checklist: Patient identified, Emergency Drugs available, Suction available and Patient being monitored Patient Re-evaluated:Patient Re-evaluated prior to induction Oxygen Delivery Method: Circle System Utilized Preoxygenation: Pre-oxygenation with 100% oxygen Induction Type: IV induction Ventilation: Mask ventilation without difficulty Laryngoscope Size: McGrath and 3 Tube type: Oral Tube size: 6.5 mm Number of attempts: 1 Airway Equipment and Method: Stylet Placement Confirmation: ETT inserted through vocal cords under direct vision, positive ETCO2 and breath sounds checked- equal and bilateral Secured at: 21 cm Tube secured with: Tape Dental Injury: Teeth and Oropharynx as per pre-operative assessment

## 2024-07-19 NOTE — Evaluation (Addendum)
 Physical Therapy Evaluation Patient Details Name: Mariah Cooper MRN: [id number] DOB: [birth year] Today's Date: 07/19/2024  History of Present Illness  Mariah Cooper is a 67yoF with chronic shoulder pain, RT tear, presenting 07/19/24 for Rt rTSA c biceps tenodesis.  Clinical Impression  Pt dressed and slung, seated in recliner on entry, sister at bedside, meal eaten. Pt reports successful OT evaluation, and successful mobility to BR earlier in day. Pt demonstrates transfers, household AMB distances, and 8 stairs all without any assistive device, uses railing at stairs, is supervision level for these, particularly due to 1 large swerve instance while AMB, however has no S/Sx of postoperative orthostasis. HEP handout provided with education directed at both patient and family. Pt endorses readiness for DC at end of evaluation, time is taken to answer all of the patient's questions. PT signing off at this time. No DME needs. Pt already scheduled for OPPT evaluation next week.       If plan is discharge home, recommend the following: A little help with walking and/or transfers;Direct supervision/assist for medications management;Direct supervision/assist for financial management;Assist for transportation;Help with stairs or ramp for entrance   Can travel by private vehicle        Equipment Recommendations None recommended by PT  Recommendations for Other Services       Functional Status Assessment Patient has had a recent decline in their functional status and demonstrates the ability to make significant improvements in function in a reasonable and predictable amount of time.     Precautions / Restrictions Precautions Precautions: Shoulder;Fall Shoulder Interventions: Shoulder sling/immobilizer;Off for dressing/bathing/exercises Precaution Booklet Issued: No Recall of Precautions/Restrictions: Intact Required Braces or Orthoses: Sling Restrictions RUE Weight Bearing Per  Provider Order: Non weight bearing      Mobility  Bed Mobility                    Transfers Overall transfer level: Needs assistance Equipment used: None Transfers: Sit to/from Stand Sit to Stand: Independent                Ambulation/Gait Ambulation/Gait assistance: Modified independent (Device/Increase time) Gait Distance (Feet): 200 Feet Assistive device: None Gait Pattern/deviations: WFL(Within Functional Limits)       General Gait Details: 1 fairly significant episode of LOP drift with delayed awareness (no physical assist required for righting)  Stairs Stairs: Yes Stairs assistance: Contact guard assist Stair Management: One rail Left Number of Stairs: 8 General stair comments: Left hand on Rt rail ascending to simulate home entry  Wheelchair Mobility     Tilt Bed    Modified Rankin (Stroke Patients Only)       Balance                                             Pertinent Vitals/Pain Pain Assessment Pain Assessment: No/denies pain    Home Living Family/patient expects to be discharged to:: Private residence Living Arrangements: Alone Available Help at Discharge: Family;Available PRN/intermittently Type of Home: House Home Access: Stairs to enter Entrance Stairs-Rails: Right Entrance Stairs-Number of Steps: 3 Alternate Level Stairs-Number of Steps: 13 Home Layout: Two level;Bed/bath upstairs;1/2 bath on main level        Prior Function Prior Level of Function : Independent/Modified Independent;Working/employed;Driving             Mobility Comments: working in an office  Extremity/Trunk Assessment   Upper Extremity Assessment Upper Extremity Assessment: RUE deficits/detail RUE: Unable to fully assess due to immobilization    Lower Extremity Assessment Lower Extremity Assessment: Overall WFL for tasks assessed       Communication   Communication Communication: No apparent difficulties     Cognition Arousal: Alert Behavior During Therapy: WFL for tasks assessed/performed   PT - Cognitive impairments: No apparent impairments                                 Cueing       General Comments      Exercises Shoulder Exercises Pendulum Exercise: AROM, Left, Self ROM, 5 reps, Seated Elbow Flexion:  (demonstration for pt education without performance in session) Wrist Flexion: AROM, Left, Right, 5 reps Wrist Extension: AROM, Left, Right, 5 reps Digit Composite Flexion: AROM, Left, Right, 5 reps Composite Extension: AROM, Left, Right, 5 reps   Assessment/Plan    PT Assessment All further PT needs can be met in the next venue of care  PT Problem List Decreased knowledge of use of DME;Decreased mobility       PT Treatment Interventions      PT Goals (Current goals can be found in the Care Plan section)  Acute Rehab PT Goals PT Goal Formulation: All assessment and education complete, DC therapy    Frequency       Co-evaluation               AM-PAC PT 6 Clicks Mobility  Outcome Measure Help needed turning from your back to your side while in a flat bed without using bedrails?: None Help needed moving from lying on your back to sitting on the side of a flat bed without using bedrails?: None Help needed moving to and from a bed to a chair (including a wheelchair)?: None Help needed standing up from a chair using your arms (e.g., wheelchair or bedside chair)?: None Help needed to walk in hospital room?: A Little Help needed climbing 3-5 steps with a railing? : A Little 6 Click Score: 22    End of Session   Activity Tolerance: Patient tolerated treatment well;No increased pain Patient left: in chair;with family/visitor present;with call bell/phone within reach   PT Visit Diagnosis: Difficulty in walking, not elsewhere classified (R26.2);Unsteadiness on feet (R26.81);Other abnormalities of gait and mobility (R26.89);Muscle weakness (generalized)  (M62.81);History of falling (Z91.81)    Time: 1243-1300 PT Time Calculation (min) (ACUTE ONLY): 17 min   Charges:   PT Evaluation $PT Eval Low Complexity: 1 Low PT Treatments $Therapeutic Activity: 8-22 mins PT General Charges $$ ACUTE PT VISIT: 1 Visit       1:19 PM, 07/19/24 Peggye JAYSON Linear, PT, DPT Physical Therapist - [facility]  [phone] (ASCOM)    Markie Frith C 07/19/2024, 1:16 PM

## 2024-07-19 NOTE — Op Note (Signed)
 SURGERY DATE: 07/19/2024   PRE-OP DIAGNOSIS:  1. Right shoulder rotator cuff arthropathy   POST-OP DIAGNOSIS:  1. Right shoulder rotator cuff arthropathy   PROCEDURES:  1. Right reverse total shoulder arthroplasty 2. Right biceps tenodesis   SURGEON: Earnestine HILARIO Blanch, MD  ASSISTANTS: Krystal Doyne, PA   ANESTHESIA: Gen + interscalene block   ESTIMATED BLOOD LOSS: 100cc   TOTAL IV FLUIDS: per anesthesia record  IMPLANTS: [employer]: RSP Glenoid Head w/Retaining screw 32-4; Monoblock Reverse Shoulder Baseplate with 6.36mm central screw; 3 locking screws into baseplate (superior, posterior, inferior); Small Shell Short Humeral Stem 10 x 48mm; Neutral Small Socket Insert;    INDICATION(S):  Mariah Cooper is a 68 y.o. female with chronic shoulder pain with difficulty liftig arm overhead. Imaging consistent with massive, irreparable rotator cuff tear. Conservative measures including medications and cortisone injections have not provided adequate relief. After discussion of risks, benefits, and alternatives to surgery, the patient elected to proceed with reverse shoulder arthroplasty and biceps tenodesis.   OPERATIVE FINDINGS: massive rotator cuff tear (complete supraspinatus, partial infraspinatus)   OPERATIVE REPORT:   I identified Mariah Cooper in the pre-operative holding area. Informed consent was obtained and the surgical site was marked. I reviewed the risks and benefits of the proposed surgical intervention and the patient wished to proceed. An interscalene block with Exparel  was administered by the Anesthesia team. The patient was transferred to the operative suite and general anesthesia was administered. The patient was placed in the beach chair position with the head of the bed elevated approximately 45 degrees. All down side pressure points were appropriately padded. Pre-op exam under anesthesia confirmed some stiffness and crepitus. Appropriate IV antibiotics were  administered. The extremity was then prepped and draped in standard fashion. A time out was performed confirming the correct extremity, correct patient, and correct procedure.   We used the standard deltopectoral incision from the coracoid to ~12cm distal. We found the cephalic vein and took it laterally. Over the course of the surgery, the cephalic vein was nicked by retraction, and was thus ligated. We opened the deltopectoral interval widely and placed retractors under the CA ligament in the subacromial space and under the deltoid tendon at its insertion. We then abducted and internally rotated the arm and released the underlying bursa between these retractors, taking care not to damage the circumflex branch of the axillary nerve.   Next, we brought the arm back in adduction at slight forward flexion with external rotation. We opened the clavipectoral fascia lateral to the conjoint tendon. We gently palpated the axillary nerve and verified its position and continuity on both sides of the humerus with a Tug test. This test was repeated multiple times during the procedure for nerve localization and confirmed to be intact at the end of the case. We then cauterized the anterior humeral circumflex ("Three sisters") vessels. The arm was then internally rotated, we cut the falciform ligament at approximately 1 cm of the upper portion of the pectoralis major insertion. Next we unroofed the bicipital groove. We proceeded with a soft tissue biceps tenodesis given the pathology (significant thickening and tendinopathy proximally) of the tendon.  After opening the biceps tendon sheath all the way to the supraglenoid tubercle, we performed a biceps tenodesis with two #2 TiCron sutures to the upper border of the pectoralis major. The proximal portion of the tendon was excised.   At this point, we could see that the supraspinatus was completely torn with a  bald humeral head superiorly. We performed a subscapularis peel  using electrocautery to remove the anterior capsule and subscapularis off of the humeral head. We released the inferior capsule from the humerus all the way to the posterior band of the inferior glenohumeral ligament. When this was complete we gently dislocated the shoulder up into the wound. We removed any osteophytes and made our cut with the appropriate inclination in 30 degrees of retroversion  We then turned our attention back to the glenoid. The proximal humerus was retracted posteriorly. The anterior capsule was dissected free from the subscapularis. The anterior capsule was then excised, exposing the anterior glenoid. We then grasped the labrum and removed it circumferentially. During the glenoid exposure, the axillary nerve was protected the entire time.    A guide with a 10 degree inferior tilt was utilized to drill the central pin. Retroversion was left unchanged in accordance with preoperative plan. An appropriately sized reamer was used to ream the glenoid. A cannulated tap was placed over the guidepin. The monoblock baseplate was inserted and excellent fixation was achieved such that the entire scapula rotated with further attempted seating of the baseplate. The 3 peripheral screws were drilled, measured, and placed. The wound was thoroughly irrigated. The glenosphere was then placed and tightened.   We then turned our attention back to the humerus. We sized for a small shell prosthesis. We sequentially used larger diameter canal finders until we met appropriate resistance and sequential broaching was performed to this size listed above. Trial poly inserts were placed. The humerus was trialed and noted to have satisfactory stability, motion, and deltoid tension with above listed poly. The trial implants were removed. 3 drill holes were placed about the lesser tuberosity footprint and FiberWire sutures were passed through these holes for subscapularis repair. Next, the implant was placed with the  appropriate retroversion. Stability was confirmed. We placed the actual poly insert. The humerus was reduced and motion, tension, and stability were satisfactory. A Hemovac drain was placed. The wound was thoroughly irrigated. Subscapularis was repaired with the previously passed #2 FiberWire sutures after passing them through the subscapularis.   We again verified the tension on the axillary nerve, appropriate range of motion, stability of the implant, and security of the subscapularis repair. We closed the deltopectoral interval deep to the cephalic vein with a running, 0-Vicryl suture. The skin was closed with 2-0 Vicryl and 4-0 Monocryl. Xeroform and Honeycomb dressing was applied. A PolarCare unit and sling were placed. Patient was extubated, transferred to a stretcher bed and to the post anesthesia care unit in stable condition.   Of note, assistance from a PA was essential to performing the surgery.  PA was present for the entire surgery.  PA assisted with patient positioning, retraction, instrumentation, and wound closure. The surgery would have been more difficult and had longer operative time without PA assistance.    POSTOPERATIVE PLAN: The patient will be admitted with plan for discharge home on POD#1. Operative arm to remain in sling at all times except RoM exercises and hygiene. Can perform pendulums, elbow/wrist/hand RoM exercises. Passive RoM allowed to 90 FF and 30 ER. ASA 325mg  x 6 weeks for DVT ppx. Plan for PT starting on POD #3-4. Patient to return to clinic in ~2 weeks for post-operative appointment.

## 2024-07-19 NOTE — Anesthesia Preprocedure Evaluation (Signed)
 Anesthesia Evaluation  Patient identified by MRN, date of birth, ID band Patient awake    Reviewed: Allergy & Precautions, NPO status , Patient's Chart, lab work & pertinent test results  History of Anesthesia Complications (+) PROLONGED EMERGENCE and history of anesthetic complications  Airway Mallampati: III  TM Distance: <3 FB Neck ROM: full    Dental  (+) Chipped, Dental Advidsory Given   Pulmonary neg shortness of breath, neg sleep apnea, COPD, neg recent URI, former smoker   Pulmonary exam normal        Cardiovascular Exercise Tolerance: Good hypertension, (-) angina (-) Past MI and (-) Cardiac Stents negative cardio ROS Normal cardiovascular exam(-) dysrhythmias (-) Valvular Problems/Murmurs     Neuro/Psych negative neurological ROS  negative psych ROS   GI/Hepatic Neg liver ROS, hiatal hernia,GERD  Controlled,,  Endo/Other  negative endocrine ROS    Renal/GU      Musculoskeletal   Abdominal   Peds  Hematology negative hematology ROS (+)   Anesthesia Other Findings Past Medical History: No date: Anemia No date: Arthritis No date: Complication of anesthesia     Comment:  HARD TIME WAKING UP-PT DOES NOT WANT GENERAL ANESTHESIA No date: GERD (gastroesophageal reflux disease)     Comment:  WILL OCCASSIONALLY TAKE MAALOX No date: History of hiatal hernia 08/2023: Stricture of sigmoid colon (HCC) 2022: Thoracic aortic atherosclerosis (HCC)  Past Surgical History: 01/15/2018: BREAST CYST ASPIRATION; Right     Comment:  Neg No date: CHOLECYSTECTOMY 07/14/2023: COLONOSCOPY; N/A     Comment:  Procedure: COLONOSCOPY;  Surgeon: Onita Elspeth Sharper,              DO;  Location: [facility] ENDOSCOPY;  Service:               Gastroenterology;  Laterality: N/A; No date: ECTOPIC PREGNANCY SURGERY No date: INCONTINENCE SURGERY     Comment:  mesh sling- 2013 No date: LAPAROSCOPIC HYSTERECTOMY 08/31/2015: OPEN  REDUCTION INTERNAL FIXATION (ORIF) DISTAL RADIAL  FRACTURE; Left     Comment:  Procedure: OPEN REDUCTION INTERNAL FIXATION (ORIF)               DISTAL RADIAL FRACTURE;  Surgeon: Kayla Pinal, MD;                Location: [facility] ORS;  Service: Orthopedics;  Laterality:               Left; 07/14/2023: POLYPECTOMY     Comment:  Procedure: POLYPECTOMY;  Surgeon: Onita Elspeth Sharper,              DO;  Location: [facility] ENDOSCOPY;  Service:               Gastroenterology;; 07/14/2023: SUBMUCOSAL TATTOO INJECTION     Comment:  Procedure: SUBMUCOSAL TATTOO INJECTION;  Surgeon: Onita Elspeth Sharper, DO;  Location: [facility] ENDOSCOPY;  Service:               Gastroenterology;;  at 25cm in colon No date: VAGINAL PROLAPSE REPAIR     Comment:  bladder tack after hysterectomy  BMI    Body Mass Index: 26.86 kg/m      Reproductive/Obstetrics negative OB ROS                              Anesthesia Physical Anesthesia Plan  ASA: 3  Anesthesia Plan: General  Post-op Pain Management: Regional block*   Induction: Intravenous  PONV Risk Score and Plan: Ondansetron , Dexamethasone  and Treatment may vary due to age or medical condition  Airway Management Planned: Oral ETT  Additional Equipment:   Intra-op Plan:   Post-operative Plan: Extubation in OR  Informed Consent: I have reviewed the patients History and Physical, chart, labs and discussed the procedure including the risks, benefits and alternatives for the proposed anesthesia with the patient or authorized representative who has indicated his/her understanding and acceptance.     Dental Advisory Given  Plan Discussed with: Anesthesiologist, CRNA and Surgeon  Anesthesia Plan Comments: (Patient consented for risks of anesthesia including but not limited to:  - adverse reactions to medications - damage to eyes, teeth, lips or other oral mucosa - nerve damage due to positioning  - sore throat or  hoarseness - Damage to heart, brain, nerves, lungs, other parts of body or loss of life  Patient voiced understanding and assent.)        Anesthesia Quick Evaluation

## 2024-07-19 NOTE — Transfer of Care (Signed)
 Immediate Anesthesia Transfer of Care Note  Patient: Mariah Cooper  Procedure(s) Performed: ARTHROPLASTY, SHOULDER, TOTAL, REVERSE (Right: Shoulder) TENODESIS, BICEPS (Right: Shoulder)  Patient Location: PACU  Anesthesia Type:General  Level of Consciousness: drowsy and responds to stimulation  Airway & Oxygen Therapy: Patient Spontanous Breathing  Post-op Assessment: Report given to RN and Post -op Vital signs reviewed and stable  Post vital signs: Reviewed and stable  Last Vitals:  Vitals Value Taken Time  BP 128/60 07/19/24 10:15  Temp 36.6 C 07/19/24 10:15  Pulse 89 07/19/24 10:16  Resp 17 07/19/24 10:16  SpO2 96 % 07/19/24 10:16  Vitals shown include unfiled device data.  Last Pain:  Vitals:   07/19/24 1015  TempSrc:   PainSc: Asleep         Complications: No notable events documented.

## 2024-07-19 NOTE — Anesthesia Procedure Notes (Signed)
 Anesthesia Regional Block: Interscalene brachial plexus block   Pre-Anesthetic Checklist: , timeout performed,  Correct Patient, Correct Site, Correct Laterality,  Correct Procedure, Correct Position, site marked,  Risks and benefits discussed,  Surgical consent,  Pre-op evaluation,  At surgeon's request and post-op pain management  Laterality: Right and Upper  Prep: chloraprep       Needles:  Injection technique: Single-shot  Needle Type: Stimiplex     Needle Length: 5cm  Needle Gauge: 22     Additional Needles:   Procedures:,,,, ultrasound used (permanent image in chart),,    Narrative:  Start time: 07/19/2024 7:46 AM End time: 07/19/2024 7:48 AM Injection made incrementally with aspirations every 5 mL.  Performed by: Personally  Anesthesiologist: Dario Barter, MD  Additional Notes: Functioning IV was confirmed and monitors were applied.  A 50mm 22ga Stimuplex needle was used. Sterile prep and drape,hand hygiene and sterile gloves were used.  Negative aspiration and negative test dose prior to incremental administration of local anesthetic. The patient tolerated the procedure well.

## 2024-07-19 NOTE — H&P (Signed)
 Paper H&P to be scanned into permanent record. H&P reviewed. No significant changes noted.

## 2024-07-20 NOTE — Anesthesia Postprocedure Evaluation (Signed)
 Anesthesia Post Note  Patient: Nikolette Reindl  Procedure(s) Performed: ARTHROPLASTY, SHOULDER, TOTAL, REVERSE (Right: Shoulder) TENODESIS, BICEPS (Right: Shoulder)  Patient location during evaluation: PACU Anesthesia Type: General Level of consciousness: awake and alert Pain management: pain level controlled Vital Signs Assessment: post-procedure vital signs reviewed and stable Respiratory status: spontaneous breathing, nonlabored ventilation, respiratory function stable and patient connected to nasal cannula oxygen Cardiovascular status: blood pressure returned to baseline and stable Postop Assessment: no apparent nausea or vomiting Anesthetic complications: no   No notable events documented.   Last Vitals:  Vitals:   07/19/24 1135 07/19/24 1305  BP: (!) 143/72 (!) 129/52  Pulse: 79 82  Resp: 16 18  Temp: 36.6 C 36.5 C  SpO2: 96% 96%    Last Pain:  Vitals:   07/19/24 1305  TempSrc: Temporal  PainSc: 0-No pain                 Prentice Murphy

## 2024-07-22 ENCOUNTER — Encounter: Payer: Self-pay | Admitting: Orthopedic Surgery

## 2024-07-23 ENCOUNTER — Encounter: Payer: Self-pay | Admitting: Orthopedic Surgery

## 2024-07-26 ENCOUNTER — Other Ambulatory Visit: Payer: Self-pay | Admitting: Acute Care

## 2024-07-26 DIAGNOSIS — Z87891 Personal history of nicotine dependence: Secondary | ICD-10-CM

## 2024-07-26 DIAGNOSIS — Z122 Encounter for screening for malignant neoplasm of respiratory organs: Secondary | ICD-10-CM

## 2024-09-04 ENCOUNTER — Ambulatory Visit
Admission: RE | Admit: 2024-09-04 | Discharge: 2024-09-04 | Disposition: A | Discharge status: Home / Self Care | Source: Ambulatory Visit | Attending: Acute Care | Admitting: Acute Care

## 2024-09-04 DIAGNOSIS — Z87891 Personal history of nicotine dependence: Secondary | ICD-10-CM | POA: Diagnosis present

## 2024-09-04 DIAGNOSIS — Z122 Encounter for screening for malignant neoplasm of respiratory organs: Secondary | ICD-10-CM | POA: Diagnosis present

## 2024-09-10 ENCOUNTER — Other Ambulatory Visit: Payer: Self-pay

## 2024-09-10 DIAGNOSIS — Z87891 Personal history of nicotine dependence: Secondary | ICD-10-CM

## 2024-09-10 DIAGNOSIS — Z122 Encounter for screening for malignant neoplasm of respiratory organs: Secondary | ICD-10-CM

## 2024-11-11 ENCOUNTER — Encounter: Payer: Self-pay | Admitting: *Deleted
